# Patient Record
Sex: Male | Born: 1980 | Race: White | Hispanic: No | Marital: Married | Smoking: Former smoker
Health system: Southern US, Community
[De-identification: ages and names within clinical notes are randomized; demographics above are authoritative.]

## PROBLEM LIST (undated history)

## (undated) DIAGNOSIS — J45909 Unspecified asthma, uncomplicated: Secondary | ICD-10-CM

## (undated) DIAGNOSIS — E7800 Pure hypercholesterolemia, unspecified: Secondary | ICD-10-CM

## (undated) DIAGNOSIS — E785 Hyperlipidemia, unspecified: Secondary | ICD-10-CM

## (undated) DIAGNOSIS — R7989 Other specified abnormal findings of blood chemistry: Secondary | ICD-10-CM

## (undated) DIAGNOSIS — R51 Headache: Secondary | ICD-10-CM

## (undated) DIAGNOSIS — R519 Headache, unspecified: Secondary | ICD-10-CM

## (undated) DIAGNOSIS — I319 Disease of pericardium, unspecified: Secondary | ICD-10-CM

## (undated) HISTORY — PX: VASECTOMY: SHX75

## (undated) HISTORY — PX: TONSILLECTOMY: SUR1361

## (undated) HISTORY — DX: Other specified abnormal findings of blood chemistry: R79.89

## (undated) HISTORY — DX: Headache, unspecified: R51.9

## (undated) HISTORY — DX: Disease of pericardium, unspecified: I31.9

## (undated) HISTORY — DX: Pure hypercholesterolemia, unspecified: E78.00

## (undated) HISTORY — PX: RIB FRACTURE SURGERY: SHX2358

## (undated) HISTORY — DX: Unspecified asthma, uncomplicated: J45.909

## (undated) HISTORY — DX: Hyperlipidemia, unspecified: E78.5

## (undated) HISTORY — DX: Headache: R51

---

## 2006-03-31 ENCOUNTER — Emergency Department: Payer: Self-pay | Admitting: Internal Medicine

## 2006-04-01 ENCOUNTER — Emergency Department: Payer: Self-pay | Admitting: Internal Medicine

## 2007-11-06 ENCOUNTER — Ambulatory Visit: Payer: Self-pay | Admitting: General Practice

## 2008-03-12 ENCOUNTER — Ambulatory Visit: Payer: Self-pay | Admitting: General Practice

## 2010-01-10 ENCOUNTER — Emergency Department: Payer: Self-pay | Admitting: Emergency Medicine

## 2010-04-15 IMAGING — CR LEFT THUMB 2+V
1 series · 3 of 3 positions shown · non-contrast
Comparison: none

REASON FOR EXAM: Fall on 11-01-07, left thumb pain
COMMENTS:

PROCEDURE:     DXR - DXR THUMB LEFT HAND (1ST DIGIT)  - November 06, 2007  [DATE]
RESULT:     No fracture, dislocation or other acute bony abnormality is
identified.

[Series 1: view not recorded · 0.17mm/px · 3 of 3 slices shown]
[im 1/3]
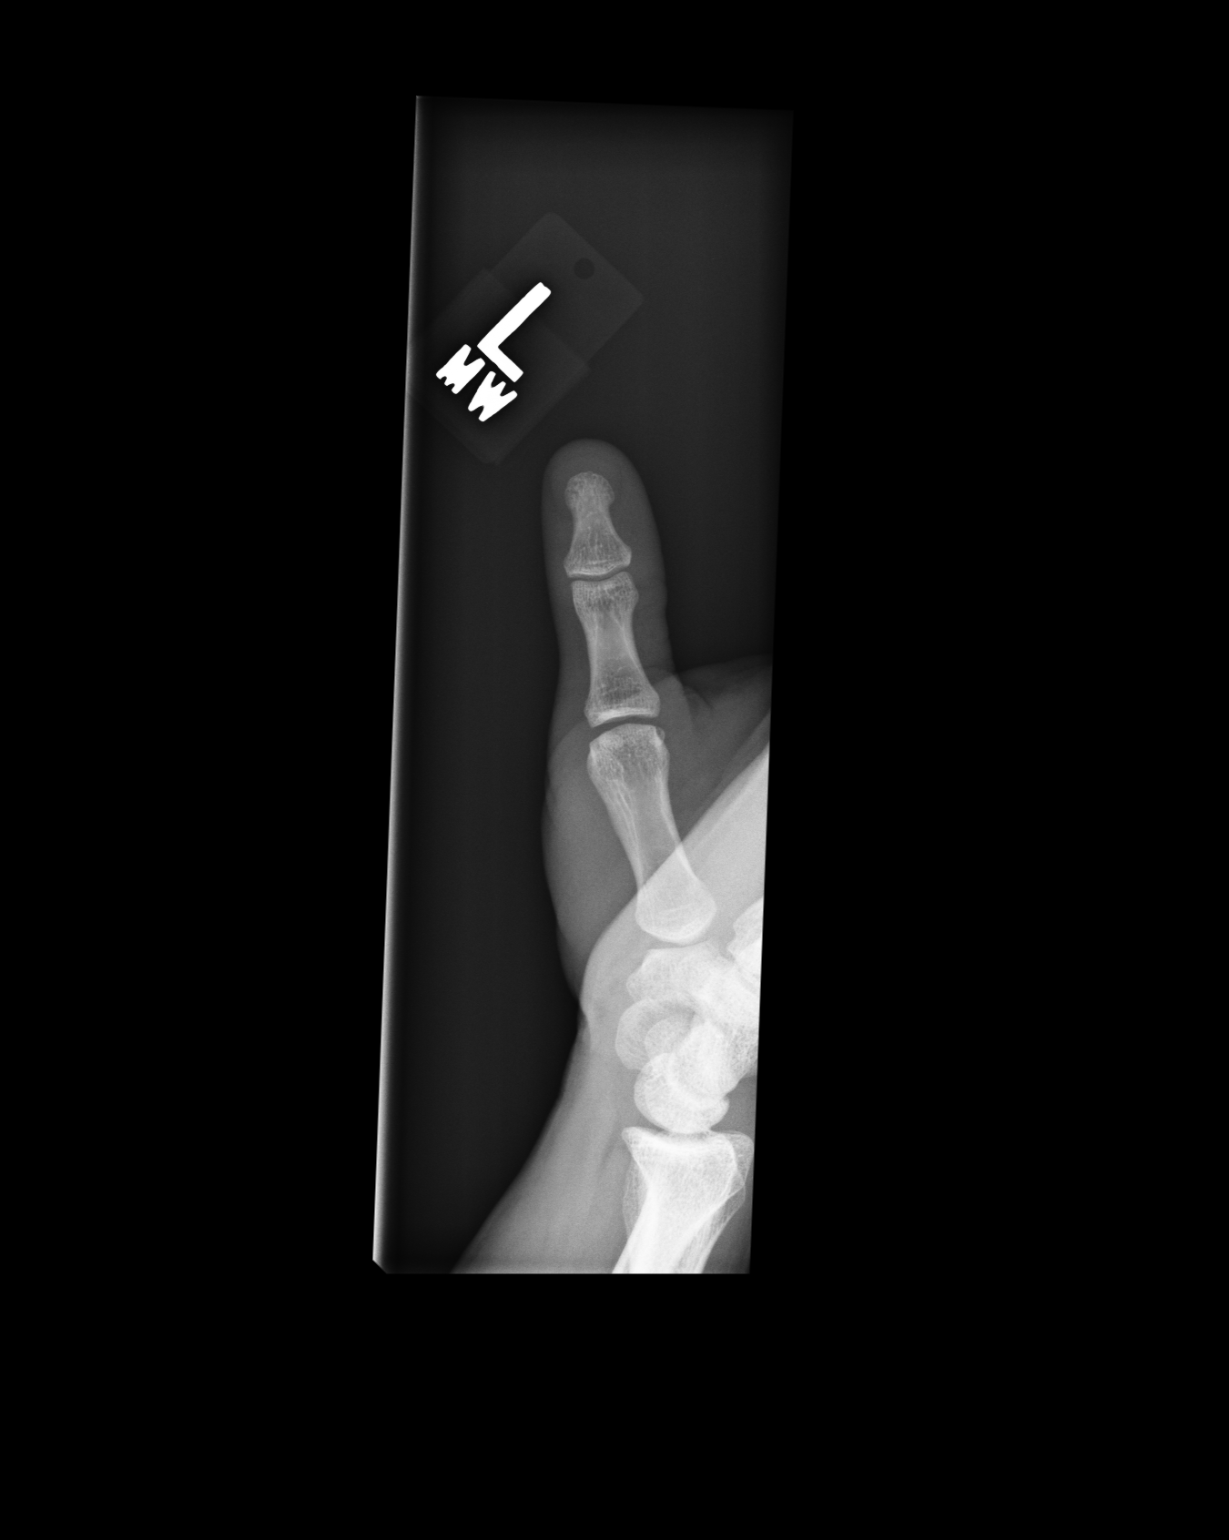
[im 2/3]
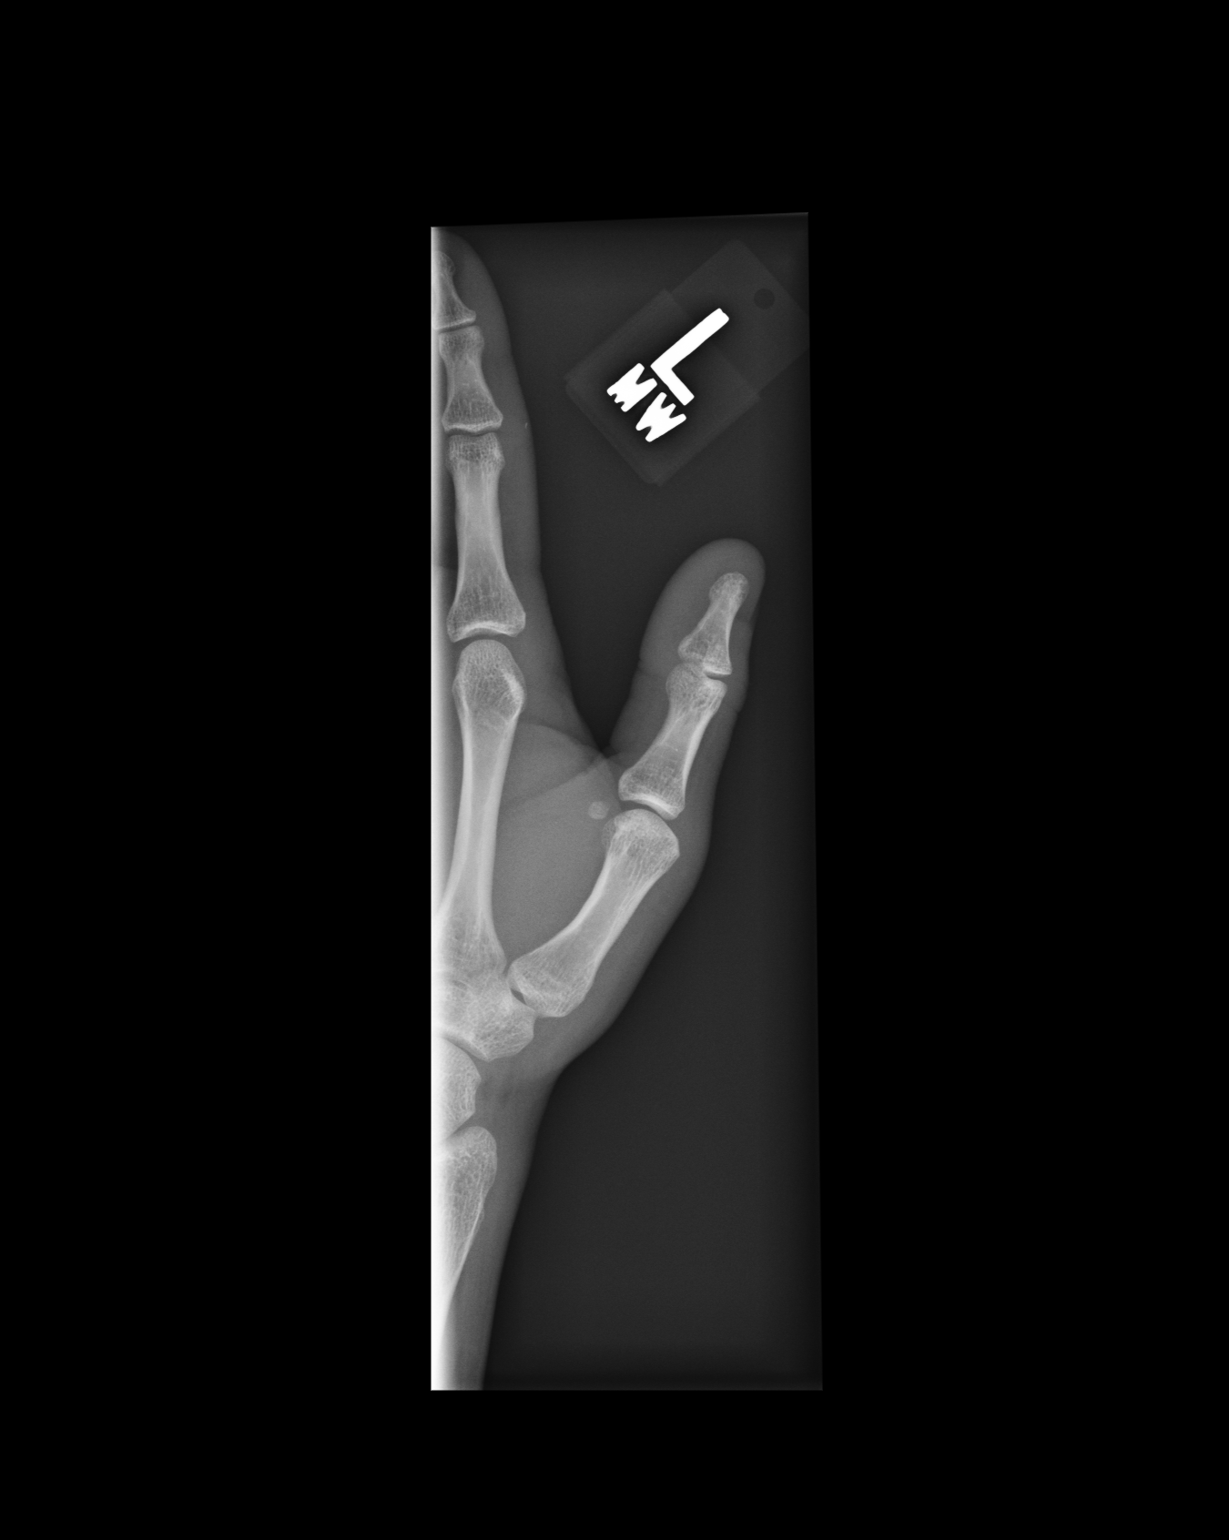
[im 3/3]
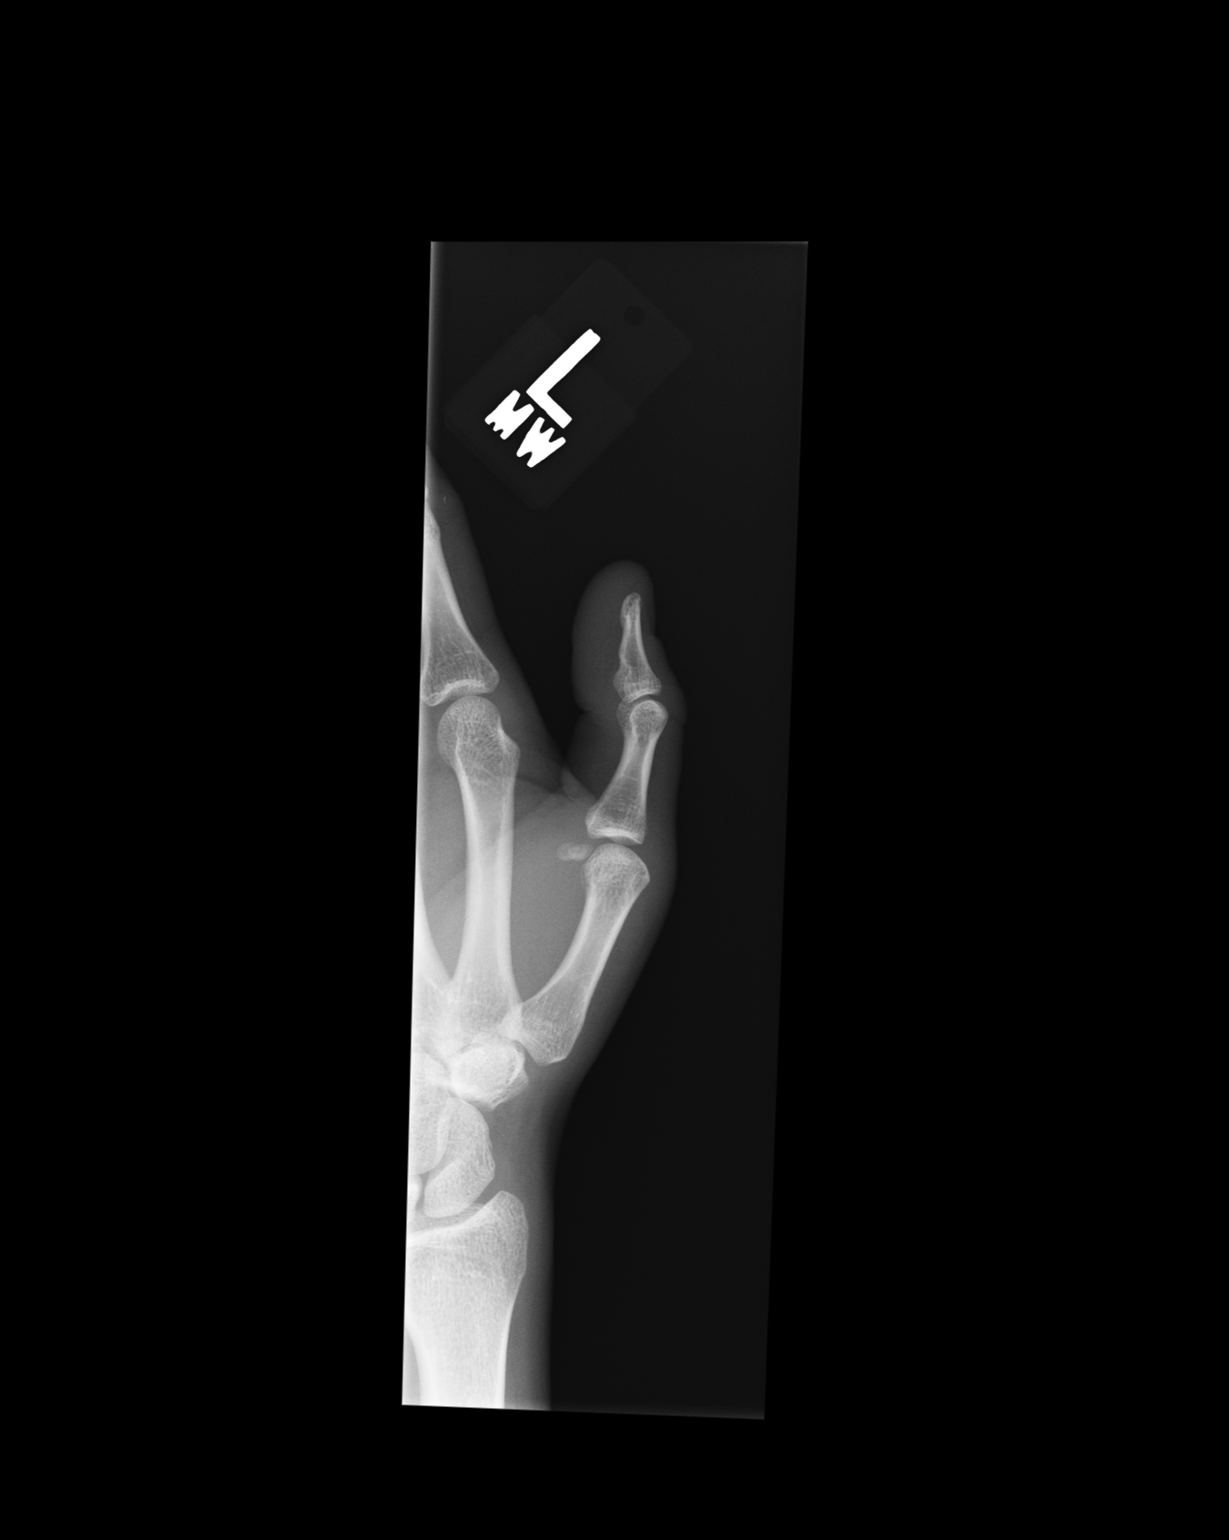

[3 of 3 positions shown; findings below may reference images not displayed]

IMPRESSION: No significant abnormalities are noted.

## 2011-02-07 ENCOUNTER — Emergency Department: Payer: Self-pay | Admitting: *Deleted

## 2012-10-27 DIAGNOSIS — M2392 Unspecified internal derangement of left knee: Secondary | ICD-10-CM | POA: Insufficient documentation

## 2013-07-17 IMAGING — CR RIGHT HAND - COMPLETE 3+ VIEW
1 series · 3 of 3 positions shown · non-contrast
Comparison: none

REASON FOR EXAM: puncture wound with bladed tool., painful and swollen
COMMENTS:

PROCEDURE:     DXR - DXR HAND RT COMPLETE W/OBLIQUES  - February 07, 2011  [DATE]
RESULT:     Comparison: None.

[Series 1: view not recorded · 0.17mm/px · 3 of 3 slices shown]
[im 1/3]
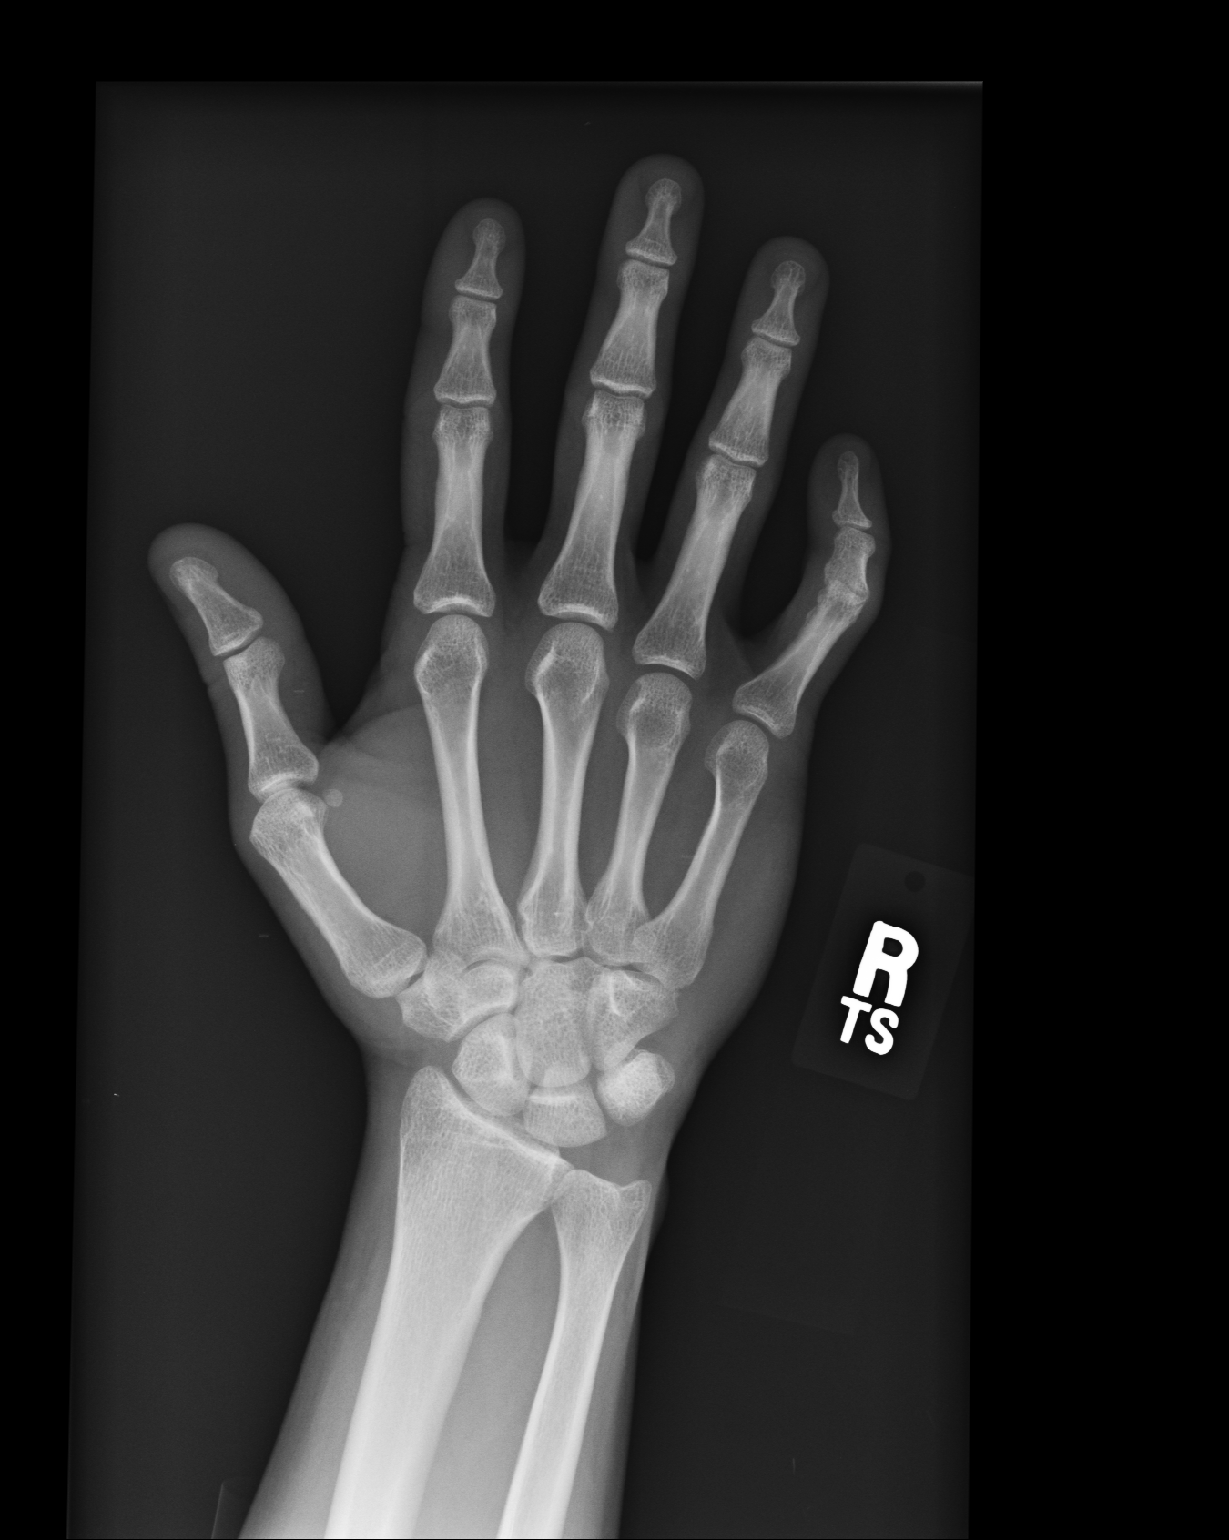
[im 2/3]
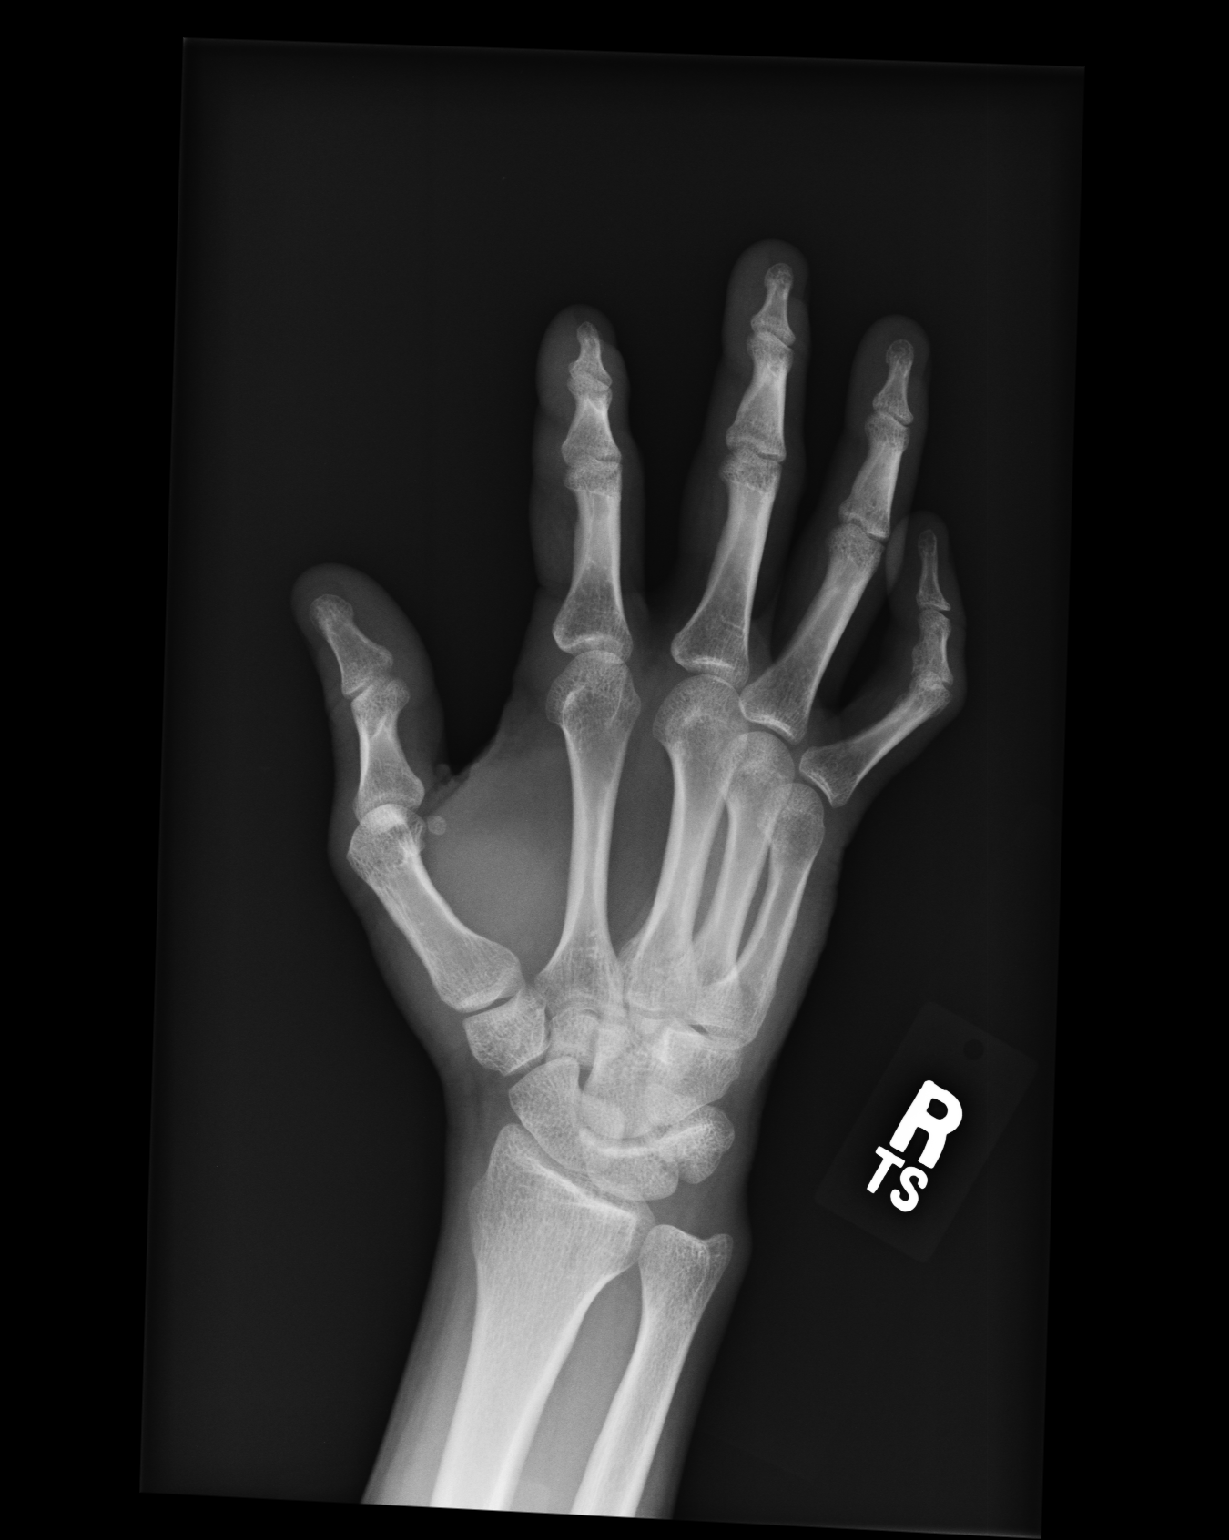
[im 3/3]
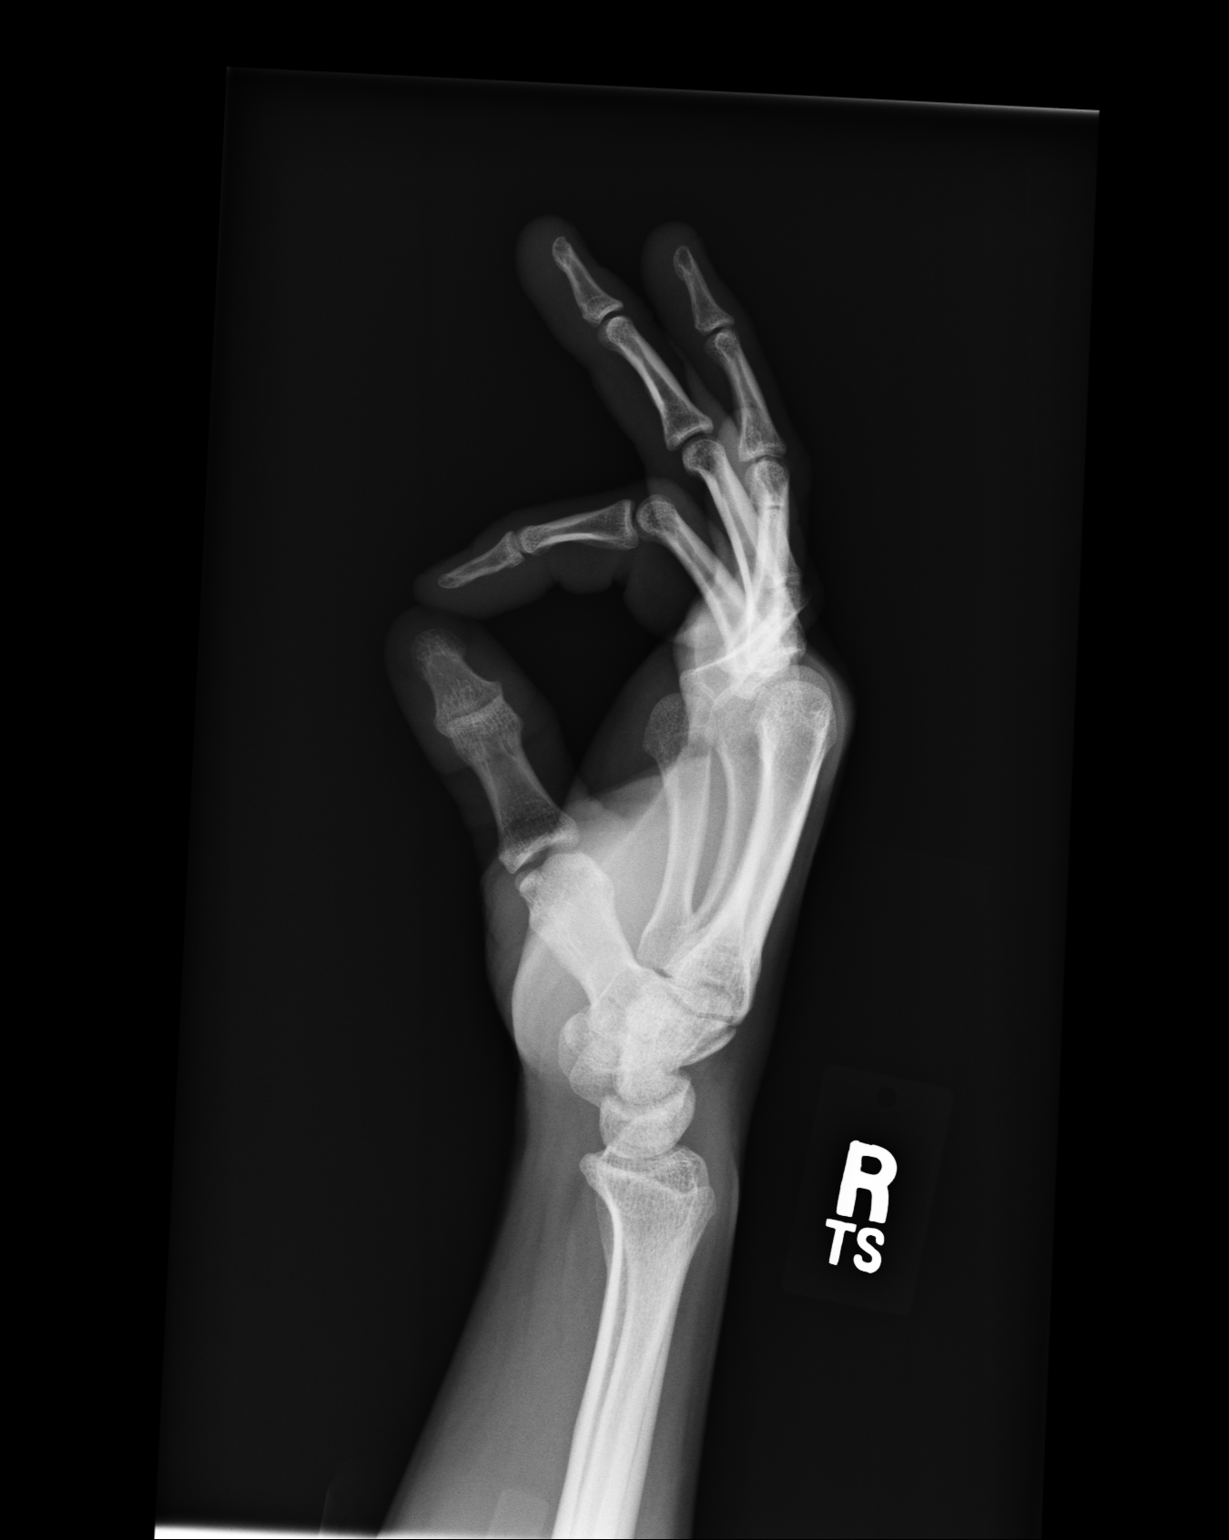

[3 of 3 positions shown; findings below may reference images not displayed]

FINDINGS: No acute fracture. Normal alignment. No radiopaque foreign body.
IMPRESSION: No radiopaque foreign body.

## 2015-09-21 ENCOUNTER — Ambulatory Visit
Admission: RE | Admit: 2015-09-21 | Discharge: 2015-09-21 | Disposition: A | Discharge status: Home / Self Care | Payer: BLUE CROSS/BLUE SHIELD | Source: Ambulatory Visit | Attending: Family Medicine | Admitting: Family Medicine

## 2015-09-21 ENCOUNTER — Other Ambulatory Visit: Payer: Self-pay | Admitting: Family Medicine

## 2015-09-21 DIAGNOSIS — R0600 Dyspnea, unspecified: Secondary | ICD-10-CM

## 2015-10-11 ENCOUNTER — Telehealth: Payer: Self-pay | Admitting: Internal Medicine

## 2015-10-11 ENCOUNTER — Ambulatory Visit (INDEPENDENT_AMBULATORY_CARE_PROVIDER_SITE_OTHER): Payer: BLUE CROSS/BLUE SHIELD | Admitting: Internal Medicine

## 2015-10-11 ENCOUNTER — Encounter: Payer: Self-pay | Admitting: Internal Medicine

## 2015-10-11 DIAGNOSIS — R0600 Dyspnea, unspecified: Secondary | ICD-10-CM

## 2015-10-11 MED ORDER — CETIRIZINE HCL 10 MG PO TABS: 10.0000 mg | ORAL_TABLET | Freq: Every day | ORAL | 3 refills | Status: DC

## 2015-10-11 MED ORDER — ALBUTEROL SULFATE HFA 108 (90 BASE) MCG/ACT IN AERS: 2.0000 | INHALATION_SPRAY | Freq: Four times a day (QID) | RESPIRATORY_TRACT | 2 refills | Status: DC | PRN

## 2015-10-11 MED ORDER — BUDESONIDE-FORMOTEROL FUMARATE 160-4.5 MCG/ACT IN AERO: 2.0000 | INHALATION_SPRAY | Freq: Two times a day (BID) | RESPIRATORY_TRACT | 0 refills | Status: DC

## 2015-10-11 NOTE — Progress Notes (Signed)
[name]      Date: 10/11/2015,   MRN# [id number] PRESTEN BEH July 07, 1980 Code Status:  Code Status History    This patient does not have a recorded code status. Please follow your organizational policy for patients in this situation.     Hosp day:@LENGTHOFSTAYDAYS @ Referring MD: @ATDPROV @     PCP:      AdmissionWeight: 164 lb (74.4 kg)                 CurrentWeight: 164 lb (74.4 kg) Alec Gonzales is a 35 y.o. old male seen in consultation for SOB at the request of Dr. Ellin Goodie.     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, works as IT sales professional, presents to office to day with worsening SOB and DOE for the past 6 weeks He has associated cough and intermittent wheezing with exertion, patient states that his exercise tolerance has declines Going up flight of stair is difficult now and takes long time to catch his breath after exertion Feels tired and fatigued much easier than before No signs of infection at this time Patient states that laughing may cause cough at times, has symptoms of runny nose, watery eyes at times Hot weather worsens his breathing  Patient obtained PFT and CXR which seem to be WNL at this time    PAST MEDICAL HISTORY   Past Medical History:  Diagnosis Date  . Headache   . Hyperlipidemia      SURGICAL HISTORY   Past Surgical History:  Procedure Laterality Date  . RIB FRACTURE SURGERY    . TONSILLECTOMY       FAMILY HISTORY   Family History  Problem Relation Age of Onset  . Hypertension Mother   . Hyperlipidemia Mother   . Hypertension Father   . Hyperlipidemia Father   . Stroke Maternal Grandfather   . Heart disease Paternal Grandfather      SOCIAL HISTORY   Social History  Substance Use Topics  . Smoking status: Current Some Day Smoker    Types: Cigarettes  . Smokeless tobacco: Former Neurosurgeon    Types: Snuff     Comment: occ  . Alcohol use  Yes     Comment: occ     MEDICATIONS    Home Medication:  Current Outpatient Rx  . Order #: [id number] Class: Normal  . Order #: [id number] Class: Sample  . Order #: [id number] Class: Normal    Current Medication:  Current Outpatient Prescriptions:  .  albuterol (PROVENTIL HFA;VENTOLIN HFA) 108 (90 Base) MCG/ACT inhaler, Inhale 2 puffs into the lungs every 6 (six) hours as needed for wheezing or shortness of breath., Disp: 1 Inhaler, Rfl: 2 .  budesonide-formoterol (SYMBICORT) 160-4.5 MCG/ACT inhaler, Inhale 2 puffs into the lungs 2 (two) times daily., Disp: 1 Inhaler, Rfl: 0 .  cetirizine (ZYRTEC) 10 MG tablet, Take 1 tablet (10 mg total) by mouth daily., Disp: 30 tablet, Rfl: 3    ALLERGIES   Prednisone     REVIEW OF SYSTEMS   Review of Systems  Constitutional: Negative for chills, diaphoresis, fever, malaise/fatigue and weight loss.  HENT: Positive for congestion. Negative for hearing loss.   Eyes: Negative for blurred vision and double vision.  Respiratory: Positive for cough, shortness of breath and wheezing. Negative for hemoptysis and sputum production.   Cardiovascular: Negative for chest pain, palpitations, orthopnea and leg swelling.  Gastrointestinal: Negative for abdominal pain, blood in  stool, constipation, diarrhea, heartburn, nausea and vomiting.  Genitourinary: Negative for dysuria and urgency.  Musculoskeletal: Negative for back pain, myalgias and neck pain.  Skin: Negative for rash.  Neurological: Positive for dizziness. Negative for tingling, tremors, weakness and headaches.  Endo/Heme/Allergies: Does not bruise/bleed easily.  Psychiatric/Behavioral: Negative for depression, substance abuse and suicidal ideas.  All other systems reviewed and are negative.    VS: BP 140/90 (BP Location: Left Arm, Cuff Size: Normal)   Pulse 64   Ht  (1.753 m)   Wt 164 lb (74.4 kg)   SpO2 98%   BMI 24.22 kg/m      PHYSICAL EXAM  Physical Exam    Constitutional: He is oriented to person, place, and time. He appears well-developed and well-nourished. No distress.  HENT:  Head: Normocephalic and atraumatic.  Mouth/Throat: No oropharyngeal exudate.  Eyes: EOM are normal. Pupils are equal, round, and reactive to light. No scleral icterus.  Neck: Normal range of motion. Neck supple.  Cardiovascular: Normal rate, regular rhythm and normal heart sounds.   No murmur heard. Pulmonary/Chest: No stridor. He has no wheezes.  Abdominal: Soft. Bowel sounds are normal.  Musculoskeletal: Normal range of motion. He exhibits no edema.  Neurological: He is alert and oriented to person, place, and time. No cranial nerve deficit.  Skin: Skin is warm. He is not diaphoretic.  Psychiatric: He has a normal mood and affect.           IMAGING    Dg Chest 2 View  Result Date: 09/22/2015 CLINICAL DATA:  Shortness of breath for 1 month. EXAM: CHEST  2 VIEW COMPARISON:  None. FINDINGS: Lungs are clear. The heart size and pulmonary vascularity are normal. No adenopathy. No bone lesions. IMPRESSION: No edema or consolidation. Electronically Signed   By: Bretta Bang III M.D.   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  The Patient requires high complexity decision making for assessment and support, frequent evaluation and titration of therapies, application of advanced monitoring technologies and extensive interpretation of multiple databases.    Patient satisfied with Plan of action and management. All questions answered  Lucie Leather, M.D.  [facility]  Medical Director [facility] Medical Director [facility] Cardio-Pulmonary Department

## 2015-10-11 NOTE — Telephone Encounter (Signed)
Pt called stating he didn't want you to send any notes to his PCP until we have done his PFT. He does work as a IT sales professional and states that if he has reactive airway disease he can cont to work but if it is asthma he would not be able to work. Informed pt we haven't sent anything to his PCP at this time. Informed that once he had the PFT done then we would go from there at his follow up. Pt is ok with this. States he wants to discuss this before you put any notes and send them.

## 2015-10-11 NOTE — Progress Notes (Signed)
Patient ID: Alec Gonzales, male   DOB: [birth year], 35 y.o.   MRN: [id number] Patient seen in the office today and instructed on use of SYMBICORT HFA.  Patient expressed understanding and demonstrated technique.

## 2015-10-11 NOTE — Patient Instructions (Signed)
Start Symbicort use as directed Albuterol as needed Start zyrtec Stop smoking   Asthma Attack Prevention While you may not be able to control the fact that you have asthma, you can take actions to prevent asthma attacks. The best way to prevent asthma attacks is to maintain good control of your asthma. You can achieve this by:  Taking your medicines as directed.  Avoiding things that can irritate your airways or make your asthma symptoms worse (asthma triggers).  Keeping track of how well your asthma is controlled and of any changes in your symptoms.  Responding quickly to worsening asthma symptoms (asthma attack).  Seeking emergency care when it is needed. WHAT ARE SOME WAYS TO PREVENT AN ASTHMA ATTACK? Have a Plan Work with your health care provider to create a written plan for managing and treating your asthma attacks (asthma action plan). This plan includes:  A list of your asthma triggers and how you can avoid them.  Information on when medicines should be taken and when their dosages should be changed.  The use of a device that measures how well your lungs are working (peak flow meter). Monitor Your Asthma Use your peak flow meter and record your results in a journal every day. A drop in your peak flow numbers on one or more days may indicate the start of an asthma attack. This can happen even before you start to feel symptoms. You can prevent an asthma attack from getting worse by following the steps in your asthma action plan. Avoid Asthma Triggers Work with your asthma health care provider to find out what your asthma triggers are. This can be done by:  Allergy testing.  Keeping a journal that notes when asthma attacks occur and the factors that may have contributed to them.  Determining if there are other medical conditions that are making your asthma worse. Once you have determined your asthma triggers, take steps to avoid them. This may include avoiding excessive or  prolonged exposure to:  Dust. Have someone dust and vacuum your home for you once or twice a week. Using a high-efficiency particulate arrestance (HEPA) vacuum is best.  Smoke. This includes campfire smoke, forest fire smoke, and secondhand smoke from tobacco products.  Pet dander. Avoid contact with animals that you know you are allergic to.  Allergens from trees, grasses or pollens. Avoid spending a lot of time outdoors when pollen counts are high, and on very windy days.  Very cold, dry, or humid air.  Mold.  Foods that contain high amounts of sulfites.  Strong odors.  Outdoor air pollutants, such as Museum/gallery exhibitions officer.  Indoor air pollutants, such as aerosol sprays and fumes from household cleaners.  Household pests, including dust mites and cockroaches, and pest droppings.  Certain medicines, including NSAIDs. Always talk to your health care provider before stopping or starting any new medicines. Medicines Take over-the-counter and prescription medicines only as told by your health care provider. Many asthma attacks can be prevented by carefully following your medicine schedule. Taking your medicines correctly is especially important when you cannot avoid certain asthma triggers. Act Quickly If an asthma attack does happen, acting quickly can decrease how severe it is and how long it lasts. Take these steps:   Pay attention to your symptoms. If you are coughing, wheezing, or having difficulty breathing, do not wait to see if your symptoms go away on their own. Follow your asthma action plan.  If you have followed your asthma action plan and your  symptoms are not improving, call your health care provider or seek immediate medical care at the nearest hospital. It is important to note how often you need to use your fast-acting rescue inhaler. If you are using your rescue inhaler more often, it may mean that your asthma is not under control. Adjusting your asthma treatment plan may help  you to prevent future asthma attacks and help you to gain better control of your condition. HOW CAN I PREVENT AN ASTHMA ATTACK WHEN I EXERCISE? Follow advice from your health care provider about whether you should use your fast-acting inhaler before exercising. Many people with asthma experience exercise-induced bronchoconstriction (EIB). This condition often worsens during vigorous exercise in cold, humid, or dry environments. Usually, people with EIB can stay very active by pre-treating with a fast-acting inhaler before exercising.   This information is not intended to replace advice given to you by your health care provider. Make sure you discuss any questions you have with your health care provider.   Document Released: 02/15/2009 Document Revised: 11/18/2014 Document Reviewed: 07/30/2014 Elsevier Interactive Patient Education [employer].

## 2015-10-11 NOTE — Telephone Encounter (Signed)
Pt calling asking if we can call him back regarding the information we will be sending to his PCP  Please do not send anything to them before talking patient  Please call back.

## 2015-11-01 ENCOUNTER — Encounter: Payer: Self-pay | Admitting: Internal Medicine

## 2015-11-01 ENCOUNTER — Encounter: Payer: Self-pay | Admitting: *Deleted

## 2015-11-01 ENCOUNTER — Ambulatory Visit (INDEPENDENT_AMBULATORY_CARE_PROVIDER_SITE_OTHER): Payer: BLUE CROSS/BLUE SHIELD | Admitting: Internal Medicine

## 2015-11-01 VITALS — BP 124/86 | HR 72 | Ht 69.0 in | Wt 167.0 lb

## 2015-11-01 DIAGNOSIS — J45909 Unspecified asthma, uncomplicated: Secondary | ICD-10-CM | POA: Insufficient documentation

## 2015-11-01 MED ORDER — FLUTICASONE FUROATE-VILANTEROL 100-25 MCG/INH IN AEPB: 1.0000 | INHALATION_SPRAY | Freq: Every day | RESPIRATORY_TRACT | 0 refills | Status: AC

## 2015-11-01 NOTE — Addendum Note (Signed)
Addended by: Meyer CoryAHMAD, Devora Tortorella R on: 11/01/2015 10:05 AM   Modules accepted: Orders

## 2015-11-01 NOTE — Progress Notes (Signed)
[facility]      Date: 11/01/2015,   MRN# [id number] Alec MaterSteven A Gonzales 1980/07/14 Code Status:  Code Status History    This patient does not have a recorded code status. Please follow your organizational policy for patients in this situation.     Hosp day:@LENGTHOFSTAYDAYS @ Referring MD: @ATDPROV @     PCP:      AdmissionWeight: 167 lb (75.8 kg)                 CurrentWeight: 167 lb (75.8 kg) Alec MaterSteven A Gonzales is a 35 y.o. old male seen in consultation for SOB at the request of Dr. Ellin Goodieabinowitz.     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  Feels tired and fatigued much easier than before No signs of infection at this time Patient states that laughing may cause cough at times, has symptoms of runny nose, watery eyes at times Hot weather worsens his breathing    Current Medication:  Current Outpatient Prescriptions:  .  albuterol (PROVENTIL HFA;VENTOLIN HFA) 108 (90 Base) MCG/ACT inhaler, Inhale 2 puffs into the lungs every 6 (six) hours as needed for wheezing or shortness of breath., Disp: 1 Inhaler, Rfl: 2 .  cetirizine (ZYRTEC) 10 MG tablet, Take 1 tablet (10 mg total) by mouth daily., Disp: 30 tablet, Rfl: 3 .  budesonide-formoterol (SYMBICORT) 160-4.5 MCG/ACT inhaler, Inhale 2 puffs into the lungs 2 (two) times daily., Disp: 1 Inhaler, Rfl: 0    ALLERGIES   Prednisone     REVIEW OF  SYSTEMS   Review of Systems  Constitutional: Negative for chills, fever, malaise/fatigue and weight loss.  HENT: Negative for congestion and hearing loss.   Eyes: Negative for blurred vision and double vision.  Respiratory: Positive for cough, shortness of breath and wheezing. Negative for hemoptysis and sputum production.   Cardiovascular: Negative for chest pain, palpitations, orthopnea and leg swelling.  Gastrointestinal: Negative for abdominal pain, heartburn, nausea and vomiting.  Skin: Negative for rash.  Neurological: Negative for headaches.  All other systems reviewed and are negative.    VS: BP 124/86 (Cuff Size: Normal)   Pulse 72   Ht 5\' 9"  (1.753 m)   Wt 167 lb (75.8 kg)   SpO2 96%   BMI 24.66 kg/m      PHYSICAL EXAM  Physical Exam  Constitutional: He is oriented to person, place, and time. He appears well-developed and well-nourished. No distress.  HENT:  Mouth/Throat: No oropharyngeal exudate.  Cardiovascular: Normal rate, regular rhythm and normal heart sounds.   No murmur heard. Pulmonary/Chest: No stridor. He has no wheezes.  Musculoskeletal: Normal range of motion. He exhibits no edema.  Neurological: He is alert and oriented to person, place, and time. No cranial nerve deficit.  Skin: Skin is warm. He is not diaphoretic.  Psychiatric: He has a normal mood and affect.      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 Symbicort is not helping will switch to different inhaler    1.stop Symbicort- will start BREO 2.albuterol as needed 3.continue zyrtec 4.smoking cessation advised 5.will write letter to chief regarding moving to another location for work as his symptoms started at end of May when he was transferred to different station -recommend changing locations  Will follow up in 3 months to assess resp status     The  Patient requires high complexity decision making for assessment and support, frequent evaluation and titration of therapies, application of advanced monitoring technologies and extensive interpretation of multiple databases.   Patient satisfied with Plan of action and management. All questions answered  Lucie LeatherKurian David Calin Fantroy, M.D.  [facility]  Medical Director [facility] Medical Director [facility] Card[facility] Department

## 2015-11-01 NOTE — Patient Instructions (Signed)
Letter to STation Chief to change stations Will stop Symbicort and Start BREO ALbuterol as needed

## 2015-11-01 NOTE — Progress Notes (Signed)
Patient ID: Alec MaterSteven A Gonzales, male   DOB: [birth year], 35 y.o.   MRN: [id number]   Patient seen in the office today and instructed on use of Breo 100.  Patient expressed understanding and demonstrated technique.

## 2015-11-11 ENCOUNTER — Telehealth: Payer: Self-pay | Admitting: Internal Medicine

## 2015-11-11 MED ORDER — FLUTICASONE FUROATE-VILANTEROL 100-25 MCG/INH IN AEPB: 1.0000 | INHALATION_SPRAY | Freq: Every day | RESPIRATORY_TRACT | 5 refills | Status: DC

## 2015-11-11 NOTE — Telephone Encounter (Signed)
°*  STAT* If patient is at the pharmacy, call can be transferred to refill team.   1. Which medications need to be refilled? (please list name of each medication and dose if known)  Brio   2. Which pharmacy/location (including street and city if local pharmacy) is medication to be sent to? [facility] [place]  3. Do they need a 30 day or 90 day supply? 90 day   Pt states he was going to get a call before we sent it in to give him instructions on how to use the coupon

## 2015-11-11 NOTE — Telephone Encounter (Signed)
Spoke with pt and informed him RX is being sent with refills. Discussed coupon. Nothing further needed.

## 2018-12-06 ENCOUNTER — Other Ambulatory Visit: Payer: Self-pay | Admitting: Internal Medicine

## 2018-12-06 ENCOUNTER — Telehealth: Payer: Self-pay

## 2018-12-06 NOTE — Telephone Encounter (Signed)
Patient called and requested refill of Breo.  Last refill was 07/17/2018 to [facility] [place].  Patient asked for name of Pulmonologist he saw in 2017 -   Kasa Kurian, MD.

## 2018-12-06 NOTE — Telephone Encounter (Signed)
Message reviewed. Tried to refill the [facility] for one month with no refills  Agree with him contacting pulmonary who prescribed this medicine for him. His pharmacy was not in [place]. Can call a one month supply into his preferred pharmacy when find this out. Thanks,

## 2018-12-11 ENCOUNTER — Ambulatory Visit: Payer: Self-pay

## 2018-12-11 DIAGNOSIS — Z23 Encounter for immunization: Secondary | ICD-10-CM

## 2018-12-13 ENCOUNTER — Telehealth: Payer: Self-pay

## 2018-12-13 NOTE — Telephone Encounter (Signed)
Alec Gonzales called stating [facility]) states they haven't received an order from Dr. Roxan Hockey to renew [facility] inhaler.  Found the notes in Epic where Dr. Roxan Hockey agreed to renew Alec Gonzales for 30 days until he sees his pulmonologist (12/27/2018).  Breo Ellipta 100/25 mcg 1 puff qd Qty. 1  Refills:  0 Prescriber:  Shiela Mayer, MD NPI # [id number] Rx called into [facility]) [phone]  AMD

## 2018-12-26 ENCOUNTER — Ambulatory Visit: Payer: Self-pay

## 2018-12-26 ENCOUNTER — Other Ambulatory Visit: Payer: Self-pay

## 2018-12-26 DIAGNOSIS — Z0000 Encounter for general adult medical examination without abnormal findings: Secondary | ICD-10-CM

## 2018-12-27 ENCOUNTER — Ambulatory Visit
Admission: RE | Admit: 2018-12-27 | Discharge: 2018-12-27 | Disposition: A | Discharge status: Home / Self Care | Payer: 59 | Attending: Internal Medicine | Admitting: Internal Medicine

## 2018-12-27 ENCOUNTER — Other Ambulatory Visit
Admission: RE | Admit: 2018-12-27 | Discharge: 2018-12-27 | Disposition: A | Discharge status: Home / Self Care | Payer: 59 | Source: Home / Self Care | Attending: Internal Medicine | Admitting: Internal Medicine

## 2018-12-27 ENCOUNTER — Ambulatory Visit
Admission: RE | Admit: 2018-12-27 | Discharge: 2018-12-27 | Disposition: A | Discharge status: Home / Self Care | Payer: 59 | Source: Ambulatory Visit | Attending: Internal Medicine | Admitting: Internal Medicine

## 2018-12-27 ENCOUNTER — Ambulatory Visit (INDEPENDENT_AMBULATORY_CARE_PROVIDER_SITE_OTHER): Payer: 59 | Admitting: Internal Medicine

## 2018-12-27 ENCOUNTER — Encounter: Payer: Self-pay | Admitting: Internal Medicine

## 2018-12-27 VITALS — BP 120/84 | HR 97 | Temp 98.6°F | Ht 68.0 in | Wt 171.4 lb

## 2018-12-27 DIAGNOSIS — R0602 Shortness of breath: Secondary | ICD-10-CM

## 2018-12-27 DIAGNOSIS — J4520 Mild intermittent asthma, uncomplicated: Secondary | ICD-10-CM

## 2018-12-27 LAB — CMP12+LP+TP+TSH+6AC+PSA+CBC…
ALT: 20 IU/L (ref 0–44)
AST: 22 IU/L (ref 0–40)
Albumin/Globulin Ratio: 2.1 (ref 1.2–2.2)
Albumin: 4.4 g/dL (ref 4.0–5.0)
Alkaline Phosphatase: 56 IU/L (ref 39–117)
BUN/Creatinine Ratio: 21 — ABNORMAL HIGH (ref 9–20)
BUN: 21 mg/dL — ABNORMAL HIGH (ref 6–20)
Basophils Absolute: 0.1 10*3/uL (ref 0.0–0.2)
Basos: 1 %
Bilirubin Total: 1.4 mg/dL — ABNORMAL HIGH (ref 0.0–1.2)
Calcium: 9.2 mg/dL (ref 8.7–10.2)
Chloride: 102 mmol/L (ref 96–106)
Chol/HDL Ratio: 6.9 ratio — ABNORMAL HIGH (ref 0.0–5.0)
Cholesterol, Total: 255 mg/dL — ABNORMAL HIGH (ref 100–199)
Creatinine, Ser: 1.01 mg/dL (ref 0.76–1.27)
EOS (ABSOLUTE): 0.2 10*3/uL (ref 0.0–0.4)
Eos: 2 %
Estimated CHD Risk: 1.5 times avg. — ABNORMAL HIGH (ref 0.0–1.0)
Free Thyroxine Index: 1.7 (ref 1.2–4.9)
GFR calc Af Amer: 110 mL/min/{1.73_m2} (ref >59)
GFR calc non Af Amer: 95 mL/min/{1.73_m2} (ref >59)
GGT: 13 IU/L (ref 0–65)
Globulin, Total: 2.1 g/dL (ref 1.5–4.5)
Glucose: 85 mg/dL (ref 65–99)
HDL: 37 mg/dL — ABNORMAL LOW (ref >39)
Hematocrit: 47.7 % (ref 37.5–51.0)
Hemoglobin: 15.7 g/dL (ref 13.0–17.7)
Immature Grans (Abs): 0 10*3/uL (ref 0.0–0.1)
Immature Granulocytes: 0 %
Iron: 162 ug/dL (ref 38–169)
LDH: 187 IU/L (ref 121–224)
LDL Chol Calc (NIH): 188 mg/dL — ABNORMAL HIGH (ref 0–99)
Lymphocytes Absolute: 2.3 10*3/uL (ref 0.7–3.1)
Lymphs: 24 %
MCH: 30.7 pg (ref 26.6–33.0)
MCHC: 32.9 g/dL (ref 31.5–35.7)
MCV: 93 fL (ref 79–97)
Monocytes Absolute: 0.7 10*3/uL (ref 0.1–0.9)
Monocytes: 7 %
Neutrophils Absolute: 6.4 10*3/uL (ref 1.4–7.0)
Neutrophils: 66 %
Phosphorus: 3.8 mg/dL (ref 2.8–4.1)
Platelets: 209 10*3/uL (ref 150–450)
Potassium: 4.4 mmol/L (ref 3.5–5.2)
Prostate Specific Ag, Serum: 1.3 ng/mL (ref 0.0–4.0)
RBC: 5.11 x10E6/uL (ref 4.14–5.80)
RDW: 12 % (ref 11.6–15.4)
Sodium: 138 mmol/L (ref 134–144)
T3 Uptake Ratio: 27 % (ref 24–39)
T4, Total: 6.4 ug/dL (ref 4.5–12.0)
TSH: 3.95 u[IU]/mL (ref 0.450–4.500)
Total Protein: 6.5 g/dL (ref 6.0–8.5)
Triglycerides: 162 mg/dL — ABNORMAL HIGH (ref 0–149)
Uric Acid: 5.9 mg/dL (ref 3.7–8.6)
VLDL Cholesterol Cal: 30 mg/dL (ref 5–40)
WBC: 9.6 10*3/uL (ref 3.4–10.8)

## 2018-12-27 LAB — POCT URINALYSIS DIPSTICK
Bilirubin, UA: NEGATIVE
Blood, UA: NEGATIVE
Glucose, UA: NEGATIVE
Ketones, UA: NEGATIVE
Leukocytes, UA: NEGATIVE
Nitrite, UA: NEGATIVE
Protein, UA: NEGATIVE
Spec Grav, UA: >=1.03 — AB (ref 1.010–1.025)
Urobilinogen, UA: 0.2 E.U./dL
pH, UA: 5.5 (ref 5.0–8.0)

## 2018-12-27 LAB — CBC WITH DIFFERENTIAL/PLATELET
Abs Immature Granulocytes: 0.02 10*3/uL (ref 0.00–0.07)
Basophils Absolute: 0.1 10*3/uL (ref 0.0–0.1)
Basophils Relative: 1 %
Eosinophils Absolute: 0.3 10*3/uL (ref 0.0–0.5)
Eosinophils Relative: 3 %
HCT: 44.2 % (ref 39.0–52.0)
Hemoglobin: 15.4 g/dL (ref 13.0–17.0)
Immature Granulocytes: 0 %
Lymphocytes Relative: 25 %
Lymphs Abs: 2.4 10*3/uL (ref 0.7–4.0)
MCH: 31.6 pg (ref 26.0–34.0)
MCHC: 34.8 g/dL (ref 30.0–36.0)
MCV: 90.8 fL (ref 80.0–100.0)
Monocytes Absolute: 0.7 10*3/uL (ref 0.1–1.0)
Monocytes Relative: 7 %
Neutro Abs: 6.2 10*3/uL (ref 1.7–7.7)
Neutrophils Relative %: 64 %
Platelets: 206 10*3/uL (ref 150–400)
RBC: 4.87 MIL/uL (ref 4.22–5.81)
RDW: 11.4 % — ABNORMAL LOW (ref 11.5–15.5)
WBC: 9.7 10*3/uL (ref 4.0–10.5)
nRBC: 0 % (ref 0.0–0.2)

## 2018-12-27 MED ORDER — ALBUTEROL SULFATE HFA 108 (90 BASE) MCG/ACT IN AERS: 2.0000 | INHALATION_SPRAY | Freq: Four times a day (QID) | RESPIRATORY_TRACT | 6 refills | Status: DC | PRN

## 2018-12-27 MED ORDER — FLUTICASONE FUROATE-VILANTEROL 100-25 MCG/INH IN AEPB: 1.0000 | INHALATION_SPRAY | Freq: Every day | RESPIRATORY_TRACT | 5 refills | Status: DC

## 2018-12-27 MED ORDER — SPIRIVA RESPIMAT 1.25 MCG/ACT IN AERS: 2.0000 | INHALATION_SPRAY | Freq: Two times a day (BID) | RESPIRATORY_TRACT | 6 refills | Status: DC

## 2018-12-27 NOTE — Patient Instructions (Signed)
OBTAIN PFT's  Check CBC with DIFF  START SPIRIVA RESPIMAT  CONTINUE BREO  ALBUTEROL AS NEEDED  GET CXR

## 2018-12-27 NOTE — Progress Notes (Signed)
[facility] Pulmonary Medicine Consultation        AdmissionWeight: 171 lb 6.4 oz (77.7 kg)                 CurrentWeight: 171 lb 6.4 oz (77.7 kg) Alec Gonzales is a 38 y.o. old male seen in consultation for SOB at the request of Dr. Ellin Goodie.     CHIEF COMPLAINT:  ASTHMA    HISTORY OF PRESENT ILLNESS    Patient was previously seen 3 years ago for worsening shortness of breath and a diagnosis of asthma There is a possibility of COPD overlap due to the fact that he is a former smoker and continues to smoke  Patient has been on Breo for the last several years and it has helped However in the past several months his shortness of breath is progressively getting worse He also has been having persistent wheezing more so over the last couple months Albuterol use is infrequent but has been using more in the last couple of months  Patient was transferred to a different fire station which he thinks was causing most of the 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 over the last 6 months Going up a flight of his stairs is difficult at times He feels tired and fatigued with chest tightness  No  exacerbation at this time No evidence of heart failure at this time No evidence or signs of infection at this time No respiratory distress No fevers, chills, nausea, vomiting, diarrhea No evidence of lower extremity edema No evidence hemoptysis   Smoking Assessment and Cessation Counseling   Upon further questioning, Patient smokes 1/4 PPD  I have advised patient to quit/stop smoking as soon as possible due to high risk for multiple medical problems  Patient  is NOT willing to quit smoking  I have advised patient that we can assist and have options of Nicotine replacement therapy. I also advised patient on behavioral therapy and can provide oral medication therapy in conjunction with the other therapies  Follow up next Office visit  for assessment of  smoking cessation  Smoking cessation counseling advised for 4 minutes    Current Medication:  Current Outpatient Medications:  .  albuterol (PROVENTIL HFA;VENTOLIN HFA) 108 (90 Base) MCG/ACT inhaler, Inhale 2 puffs into the lungs every 6 (six) hours as needed for wheezing or shortness of breath., Disp: 1 Inhaler, Rfl: 2 .  cetirizine (ZYRTEC) 10 MG tablet, Take 1 tablet (10 mg total) by mouth daily., Disp: 30 tablet, Rfl: 3 .  fluticasone furoate-vilanterol (BREO ELLIPTA) 100-25 MCG/INH AEPB, Inhale 1 puff into the lungs daily., Disp: 60 each, Rfl: 5 .  Multiple Vitamin (MULTIVITAMIN) tablet, Take 1 tablet by mouth daily., Disp: , Rfl:  .  omega-3 acid ethyl esters (LOVAZA) 1 g capsule, Take 1 g by mouth daily., Disp: , Rfl:  .  vitamin C (ASCORBIC ACID) 500 MG tablet, Take 500 mg by mouth daily., Disp: , Rfl:     ALLERGIES   Prednisone     REVIEW OF SYSTEMS   Review of Systems  Constitutional: Negative for chills, fever, malaise/fatigue and weight loss.  HENT: Negative for congestion and hearing loss.   Eyes: Negative for blurred vision and double vision.  Respiratory: Positive for cough, shortness of breath and wheezing. Negative for hemoptysis and sputum production.   Cardiovascular: Negative for chest pain, palpitations, orthopnea and leg swelling.  Gastrointestinal: Negative for abdominal pain, heartburn, nausea and vomiting.  Skin: Negative  for rash.  Neurological: Negative for headaches.  All other systems reviewed and are negative.    VS: BP 120/84   Pulse 97   Temp 98.6 F (37 C) (Temporal)   Ht 5\' 8"  (1.727 m)   Wt 171 lb 6.4 oz (77.7 kg)   SpO2 98%   BMI 26.06 kg/m      PHYSICAL EXAM  Physical Exam  Constitutional: He is oriented to person, place, and time. He appears well-developed and well-nourished. No distress.  HENT:  Mouth/Throat: No oropharyngeal exudate.  Cardiovascular: Normal rate, regular rhythm and normal heart sounds.  No murmur heard.  Pulmonary/Chest: No stridor. He has no wheezes.  Musculoskeletal: Normal range of motion.        General: No edema.  Neurological: He is alert and oriented to person, place, and time. No cranial nerve deficit.  Skin: Skin is warm. He is not diaphoretic.  Psychiatric: He has a normal mood and affect.      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  The importance of social distancing was discussed today. Hand Washing Techniques and avoid touching face was advised.     MEDICATION ADJUSTMENTS/LABS AND TESTS ORDERED: OBTAIN PFT's  Check CBC with DIFF  START SPIRIVA RESPIMAT  CONTINUE BREO  ALBUTEROL AS NEEDED  GET CXR   CURRENT MEDICATIONS REVIEWED AT LENGTH WITH PATIENT TODAY   Patient satisfied with Plan of action and management. All questions answered  Follow up in 3-6 months   Alec Gonzales, M.D.  [facility]  Medical Director [place] Director [facility] Cardio-Pulmonary Department

## 2018-12-30 ENCOUNTER — Telehealth: Payer: Self-pay

## 2018-12-30 NOTE — Telephone Encounter (Signed)
Received notes from 12/27/2018 visit with  Flora Lipps, MD (Pulmonologist) & they were reviewed by Randel Pigg, PA-C (Interim Provider on 12/30/2018).  AMD

## 2019-02-17 ENCOUNTER — Telehealth: Payer: Self-pay | Admitting: Internal Medicine

## 2019-02-17 NOTE — Telephone Encounter (Signed)
Called and spoke with Pamala Hurry and canceled patient's PFT for 02/20/2019. Advised Cardiopulmonary that per pt that he would contact Cardiopulmonary back directly if he decided to R/S per phone message.  Pt was given Cardiopulmonary phone number. Nothing else needed at this time. Rhonda J Cobb

## 2019-02-17 NOTE — Telephone Encounter (Signed)
Spoke to pt, who stated that he wished to cancel PFT, as he will be having one at work.  Pt stated that he will contact pulmonary cardio if he wishes to reschedule. Pt has been provided with number.   Rhonda, please advise.

## 2019-02-18 ENCOUNTER — Encounter: Payer: Self-pay | Admitting: Occupational Medicine

## 2019-02-18 ENCOUNTER — Ambulatory Visit: Payer: 59 | Admitting: Occupational Medicine

## 2019-02-18 ENCOUNTER — Other Ambulatory Visit: Payer: Self-pay

## 2019-02-18 VITALS — BP 124/88 | HR 95 | Temp 98.7°F | Resp 16 | Ht 68.0 in | Wt 172.0 lb

## 2019-02-18 DIAGNOSIS — Z0000 Encounter for general adult medical examination without abnormal findings: Secondary | ICD-10-CM

## 2019-02-19 ENCOUNTER — Other Ambulatory Visit: Payer: 59

## 2019-02-20 ENCOUNTER — Ambulatory Visit: Payer: 59

## 2019-06-11 DIAGNOSIS — H5213 Myopia, bilateral: Secondary | ICD-10-CM | POA: Diagnosis not present

## 2019-06-12 ENCOUNTER — Ambulatory Visit: Payer: 59 | Admitting: Physician Assistant

## 2019-06-12 ENCOUNTER — Other Ambulatory Visit: Payer: Self-pay

## 2019-06-12 ENCOUNTER — Encounter: Payer: Self-pay | Admitting: Physician Assistant

## 2019-06-12 VITALS — BP 131/97 | HR 94 | Temp 98.4°F | Resp 12 | Ht 68.0 in | Wt 173.0 lb

## 2019-06-12 DIAGNOSIS — B356 Tinea cruris: Secondary | ICD-10-CM

## 2019-06-12 MED ORDER — NYSTATIN 100000 UNIT/GM EX CREA: 1.0000 "application " | TOPICAL_CREAM | Freq: Two times a day (BID) | CUTANEOUS | 0 refills | Status: DC

## 2019-06-12 NOTE — Progress Notes (Signed)
Whelps located on hips, upper thighs & buttocks - have been there for about 2 months. Denies pain or itching. Hasn't tried any OTC meds.  States wife is concerned & worried about them.  No changes in personal hygiene products or home cleaning products. 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  AMD

## 2019-06-12 NOTE — Progress Notes (Signed)
   Subjective:Rash    Patient ID: Alec Gonzales, male    DOB: November 06, 1980, 39 y.o.   MRN: [id number]  HPI Patient presents with 2 months of a non-itchy rash to the groin and buttocks area.  Patient states rash started out as a single lesion on the left lateral hip.  Patient state the rash continues to spread bilaterally.  Patient denies new personal or hygiene products.  Except for shaving of the pubic and lower abdominal area no other provocative incident for complaint.   Review of Systems Asthma and allergic rhinitis.    Objective:   Physical Exam No acute distress.  Multiple erythematous macular lesions to the buttocks and lower trunk area.  Definitive scaly borders.  No signs assessment excoriation.       Assessment & Plan: Tinea Cruris  Patient rash is consistent with a fungal infection.  Advised to take cell phone pictures of the lesions.  Patient is allergic to steroids so will be using only nystatin.  Patient will apply medication twice a day.  Advised follow-up in 2 weeks.  If no resolution or worsening complaint will consider dermatology consult.

## 2019-06-19 ENCOUNTER — Other Ambulatory Visit: Payer: Self-pay

## 2019-06-19 ENCOUNTER — Ambulatory Visit: Payer: Self-pay

## 2019-06-19 DIAGNOSIS — Z0000 Encounter for general adult medical examination without abnormal findings: Secondary | ICD-10-CM

## 2019-06-19 LAB — POCT URINALYSIS DIPSTICK
Bilirubin, UA: NEGATIVE
Blood, UA: NEGATIVE
Glucose, UA: NEGATIVE
Ketones, UA: POSITIVE
Leukocytes, UA: NEGATIVE
Nitrite, UA: NEGATIVE
Protein, UA: POSITIVE — AB
Spec Grav, UA: 1.025 (ref 1.010–1.025)
Urobilinogen, UA: 0.2 E.U./dL
pH, UA: 6 (ref 5.0–8.0)

## 2019-06-19 NOTE — Progress Notes (Signed)
Scheduled to complete physical on 07/01/2019 with Bridget Hartshorn, PA-C (Interim Provider).  AMD

## 2019-06-20 LAB — CMP12+LP+TP+TSH+6AC+PSA+CBC…
ALT: 26 IU/L (ref 0–44)
AST: 24 IU/L (ref 0–40)
Albumin/Globulin Ratio: 2 (ref 1.2–2.2)
Albumin: 4.3 g/dL (ref 4.0–5.0)
Alkaline Phosphatase: 51 IU/L (ref 39–117)
BUN/Creatinine Ratio: 18 (ref 9–20)
BUN: 21 mg/dL — ABNORMAL HIGH (ref 6–20)
Basophils Absolute: 0.1 10*3/uL (ref 0.0–0.2)
Basos: 1 %
Bilirubin Total: 1.2 mg/dL (ref 0.0–1.2)
Calcium: 9.5 mg/dL (ref 8.7–10.2)
Chloride: 104 mmol/L (ref 96–106)
Chol/HDL Ratio: 5.9 ratio — ABNORMAL HIGH (ref 0.0–5.0)
Cholesterol, Total: 246 mg/dL — ABNORMAL HIGH (ref 100–199)
Creatinine, Ser: 1.17 mg/dL (ref 0.76–1.27)
EOS (ABSOLUTE): 0.3 10*3/uL (ref 0.0–0.4)
Eos: 4 %
Estimated CHD Risk: 1.2 times avg. — ABNORMAL HIGH (ref 0.0–1.0)
Free Thyroxine Index: 1.4 (ref 1.2–4.9)
GFR calc Af Amer: 92 mL/min/{1.73_m2} (ref >59)
GFR calc non Af Amer: 80 mL/min/{1.73_m2} (ref >59)
GGT: 13 IU/L (ref 0–65)
Globulin, Total: 2.1 g/dL (ref 1.5–4.5)
Glucose: 87 mg/dL (ref 65–99)
HDL: 42 mg/dL (ref >39)
Hematocrit: 46.5 % (ref 37.5–51.0)
Hemoglobin: 16.1 g/dL (ref 13.0–17.7)
Immature Grans (Abs): 0 10*3/uL (ref 0.0–0.1)
Immature Granulocytes: 0 %
Iron: 159 ug/dL (ref 38–169)
LDH: 184 IU/L (ref 121–224)
LDL Chol Calc (NIH): 191 mg/dL — ABNORMAL HIGH (ref 0–99)
Lymphocytes Absolute: 2 10*3/uL (ref 0.7–3.1)
Lymphs: 28 %
MCH: 32 pg (ref 26.6–33.0)
MCHC: 34.6 g/dL (ref 31.5–35.7)
MCV: 92 fL (ref 79–97)
Monocytes Absolute: 0.6 10*3/uL (ref 0.1–0.9)
Monocytes: 8 %
Neutrophils Absolute: 4.3 10*3/uL (ref 1.4–7.0)
Neutrophils: 59 %
Phosphorus: 3.2 mg/dL (ref 2.8–4.1)
Platelets: 187 10*3/uL (ref 150–450)
Potassium: 4.3 mmol/L (ref 3.5–5.2)
Prostate Specific Ag, Serum: 0.8 ng/mL (ref 0.0–4.0)
RBC: 5.03 x10E6/uL (ref 4.14–5.80)
RDW: 11.9 % (ref 11.6–15.4)
Sodium: 140 mmol/L (ref 134–144)
T3 Uptake Ratio: 25 % (ref 24–39)
T4, Total: 5.4 ug/dL (ref 4.5–12.0)
TSH: 3.19 u[IU]/mL (ref 0.450–4.500)
Total Protein: 6.4 g/dL (ref 6.0–8.5)
Triglycerides: 74 mg/dL (ref 0–149)
Uric Acid: 6.2 mg/dL (ref 3.8–8.4)
VLDL Cholesterol Cal: 13 mg/dL (ref 5–40)
WBC: 7.3 10*3/uL (ref 3.4–10.8)

## 2019-07-01 ENCOUNTER — Encounter: Payer: Self-pay | Admitting: Emergency Medicine

## 2019-07-01 ENCOUNTER — Other Ambulatory Visit: Payer: Self-pay

## 2019-07-01 ENCOUNTER — Ambulatory Visit: Payer: Self-pay | Admitting: Emergency Medicine

## 2019-07-01 VITALS — BP 122/90 | HR 86 | Temp 98.0°F | Resp 12 | Wt 169.0 lb

## 2019-07-01 DIAGNOSIS — R21 Rash and other nonspecific skin eruption: Secondary | ICD-10-CM

## 2019-07-01 DIAGNOSIS — Z0000 Encounter for general adult medical examination without abnormal findings: Secondary | ICD-10-CM

## 2019-07-01 NOTE — Progress Notes (Signed)
Labs & EKG completed physical 06/19/19.  AMD

## 2019-07-01 NOTE — Progress Notes (Signed)
I have reviewed the triage vital signs and the nursing notes.   HISTORY  Chief Complaint Employment Physical Engineer, drilling) and Follow-up (Tinea Cruris)   HPI Alec Gonzales is a 39 y.o. male presents for annual physical.  Patient also was seen 2 weeks ago for a rash and has been using nystatin without any relief.  He was seen in the office where originally it was thought to be a fungal infection.      Past Medical History:  Diagnosis Date  . Elevated LDL cholesterol level   . Headache   . Hyperlipidemia   . Low testosterone   . Pericarditis   . Reactive airway disease    allergic    Patient Active Problem List   Diagnosis Date Noted  . Mild reactive airways disease 11/01/2015  . Internal derangement of left knee 10/27/2012    Past Surgical History:  Procedure Laterality Date  . RIB FRACTURE SURGERY    . TONSILLECTOMY    . VASECTOMY      Prior to Admission medications   Medication Sig Start Date End Date Taking? Authorizing Provider  albuterol (VENTOLIN HFA) 108 (90 Base) MCG/ACT inhaler Inhale 2 puffs into the lungs every 6 (six) hours as needed for wheezing or shortness of breath. 12/27/18  Yes Flora Lipps, MD  cetirizine (ZYRTEC) 10 MG tablet Take 1 tablet (10 mg total) by mouth daily. 10/11/15  Yes Kasa, Maretta Bees, MD  nystatin cream (MYCOSTATIN) Apply 1 application topically 2 (two) times daily. 06/12/19  Yes Sable Feil, PA-C  Tiotropium Bromide Monohydrate (SPIRIVA RESPIMAT) 1.25 MCG/ACT AERS Inhale 2 puffs into the lungs 2 (two) times daily. 12/27/18  Yes Kasa, Maretta Bees, MD  fluticasone furoate-vilanterol (BREO ELLIPTA) 100-25 MCG/INH AEPB Inhale 1 puff into the lungs daily. Patient not taking: Reported on 07/01/2019 12/27/18   Flora Lipps, MD  Multiple Vitamin (MULTIVITAMIN) tablet Take 1 tablet by mouth daily.    [provider]  omega-3 acid ethyl esters (LOVAZA) 1 g capsule Take 1 g by mouth daily.    [provider]  vitamin C (ASCORBIC  ACID) 500 MG tablet Take 500 mg by mouth daily.    [provider]    Allergies Prednisone  Family History  Problem Relation Age of Onset  . Hypertension Mother   . Hyperlipidemia Mother   . Hypertension Father   . Hyperlipidemia Father   . Stroke Maternal Grandfather   . Heart disease Paternal Grandfather     Social History Social History   Tobacco Use  . Smoking status: Former Smoker    Types: Cigarettes    Quit date: 08/03/2015    Years since quitting: 3.9  . Smokeless tobacco: Former Systems developer    Types: Snuff  . Tobacco comment: occ  Substance Use Topics  . Alcohol use: Yes    Comment: occ  . Drug use: No    Review of Systems Constitutional: No fever/chills Eyes: No visual changes. ENT: No sore throat. Cardiovascular: Denies chest pain. Respiratory: Denies shortness of breath. Gastrointestinal: No abdominal pain.  No nausea, no vomiting.  Genitourinary: Negative for dysuria. Musculoskeletal: Negative for back pain. Skin: Negative for rash. Neurological: Negative for headaches, focal weakness or numbness. ____________________________________________   PHYSICAL EXAM: Constitutional: Alert and oriented. Well appearing and in no acute distress. Eyes: Conjunctivae are normal. PERRL. EOMI. Head: Atraumatic. Neck: No stridor.  No cervical tenderness on palpation posteriorly. Cardiovascular: Normal rate, regular rhythm. Grossly normal heart sounds.  Good peripheral circulation. Respiratory: Normal respiratory  effort.  No retractions. Lungs CTAB. Gastrointestinal: Soft and nontender. No distention.  Bowel sounds normoactive x4 quadrants. Musculoskeletal: Nontender thoracic or lumbar spine.  Straight leg raises are negative.  Good muscle strength bilaterally.  No tenderness or difficulty with joints.  Normal gait was noted. Neurologic:  Normal speech and language. No gross focal neurologic deficits are appreciated. No gait instability. Skin:  Skin is warm, dry  and intact.  Irregular flat erythematous rash generalized in the torso, buttocks and upper thigh. Psychiatric: Mood and affect are normal. Speech and behavior are normal.  ____________________________________________   LABS Labs were discussed with patient.  EKG  Sinus rhythm with a ventricular rate of 74.  ____________________________________________   FINAL CLINICAL IMPRESSION(S)  39 year old male normal physical.  Patient also has a rash that has not improved with nystatin for 2 weeks.  He will follow up with dermatology for further evaluation of this rash.    ED Discharge Orders    None       Note:  This document was prepared using Dragon voice recognition software and may include unintentional dictation errors.

## 2019-07-16 ENCOUNTER — Other Ambulatory Visit: Payer: Self-pay | Admitting: Internal Medicine

## 2019-07-16 DIAGNOSIS — J4520 Mild intermittent asthma, uncomplicated: Secondary | ICD-10-CM

## 2019-07-16 DIAGNOSIS — R0602 Shortness of breath: Secondary | ICD-10-CM

## 2019-08-14 ENCOUNTER — Other Ambulatory Visit: Payer: Self-pay | Admitting: Internal Medicine

## 2019-08-14 DIAGNOSIS — J4520 Mild intermittent asthma, uncomplicated: Secondary | ICD-10-CM

## 2019-08-14 DIAGNOSIS — R0602 Shortness of breath: Secondary | ICD-10-CM

## 2019-12-17 ENCOUNTER — Ambulatory Visit: Payer: Self-pay | Admitting: Emergency Medicine

## 2019-12-17 ENCOUNTER — Encounter: Payer: Self-pay | Admitting: Emergency Medicine

## 2019-12-17 ENCOUNTER — Other Ambulatory Visit: Payer: Self-pay

## 2019-12-17 VITALS — BP 136/91 | HR 99 | Temp 98.2°F | Resp 16 | Ht 68.0 in | Wt 177.0 lb

## 2019-12-17 DIAGNOSIS — R5383 Other fatigue: Secondary | ICD-10-CM

## 2019-12-17 DIAGNOSIS — R6882 Decreased libido: Secondary | ICD-10-CM

## 2019-12-17 NOTE — Progress Notes (Signed)
I have reviewed the triage vital signs and the nursing notes.   HISTORY  Chief Complaint medication question  HPI Alec Gonzales is a 38 y.o. male is here with concerns of possible low testosterone.  Patient was treated sometime in the 2010 area for low testosterone with AndroGel.  He states he did not see any improvement with AndroGel and that it was more trouble than it was worth.  Patient now feels fatigued with low libido.  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  Patient states that most the time he falls asleep shortly after taking a shower which is upsetting to his spouse.     Past Medical History:  Diagnosis Date  . Elevated LDL cholesterol level   . Headache   . Hyperlipidemia   . Low testosterone   . Pericarditis   . Reactive airway disease    allergic    Patient Active Problem List   Diagnosis Date Noted  . Mild reactive airways disease 11/01/2015  . Internal derangement of left knee 10/27/2012    Past Surgical History:  Procedure Laterality Date  . RIB FRACTURE SURGERY    . TONSILLECTOMY    . VASECTOMY      Prior to Admission medications   Medication Sig Start Date End Date Taking? Authorizing Provider  albuterol (VENTOLIN HFA) 108 (90 Base) MCG/ACT inhaler Inhale 2 puffs into the lungs every 6 (six) hours as needed for wheezing or shortness of breath. 12/27/18  Yes Kasa, Wallis Bamberg, MD  BREO ELLIPTA 100-25 MCG/INH AEPB INHALE 1 PUFF BY MOUTH EVERY DAY 08/14/19  Yes Kasa, Wallis Bamberg, MD  cetirizine (ZYRTEC) 10 MG tablet Take 1 tablet (10 mg total) by mouth daily. 10/11/15  Yes Erin Fulling, MD  Multiple Vitamin (MULTIVITAMIN) tablet Take 1 tablet by mouth daily.   Yes [provider]  omega-3 acid ethyl esters (LOVAZA) 1 g capsule Take 1 g by mouth daily.   Yes [provider]  vitamin C (ASCORBIC ACID) 500 MG tablet Take 500 mg by mouth daily.   Yes [provider]   nystatin cream (MYCOSTATIN) Apply 1 application topically 2 (two) times daily. 06/12/19   Joni Reining, PA-C  Tiotropium Bromide Monohydrate (SPIRIVA RESPIMAT) 1.25 MCG/ACT AERS Inhale 2 puffs into the lungs 2 (two) times daily. 12/27/18   Erin Fulling, MD    Allergies Prednisone  Family History  Problem Relation Age of Onset  . Hypertension Mother   . Hyperlipidemia Mother   . Hypertension Father   . Hyperlipidemia Father   . Stroke Maternal Grandfather   . Heart disease Paternal Grandfather     Social History Social History   Tobacco Use  . Smoking status: Former Smoker    Types: Cigarettes    Quit date: 08/03/2015    Years since quitting: 4.3  . Smokeless tobacco: Former Neurosurgeon    Types: Snuff  . Tobacco comment: occ  Substance Use Topics  . Alcohol use: Yes    Comment: occ  . Drug use: No    Review of Systems Constitutional: No fever/chills.  Fatigue. Cardiovascular: Denies chest pain. Respiratory: Denies shortness of breath. Gastrointestinal: No abdominal pain.   Genitourinary: Decreased libido. Musculoskeletal: Negative for muscle aches. Skin: Negative for rash. Neurological: Negative for headaches, focal weakness or numbness. ____________________________________________   PHYSICAL EXAM: Constitutional: Alert and oriented. Well appearing and in no acute distress. Eyes: Conjunctivae are normal. PERRL. EOMI. Head: Atraumatic.  Neck: No stridor.  Respiratory: Normal respiratory effort.  Gastrointestinal:  No distention. Neurologic:  Normal speech and language. No gross focal neurologic deficits are appreciated. No gait instability. Skin:  Skin is warm, dry and intact. No rash noted. Psychiatric: Mood and affect are normal. Speech and behavior are normal.  ____________________________________________   LABS (all labs ordered are listed, but only abnormal results are displayed)  Testosterone panel to be  performed.   ____________________________________________   FINAL CLINICAL IMPRESSION(S)  Fatigue and decreased libido.  Will check testosterone panel before treating.   ED Discharge Orders    None       Note:  This document was prepared using Dragon voice recognition software and may include unintentional dictation errors.

## 2019-12-17 NOTE — Progress Notes (Signed)
Pt has questions on past medications. CL,RMA

## 2019-12-19 ENCOUNTER — Other Ambulatory Visit: Payer: Self-pay

## 2019-12-19 DIAGNOSIS — R6882 Decreased libido: Secondary | ICD-10-CM

## 2019-12-24 LAB — TESTOSTERONE,FREE AND TOTAL
Testosterone, Free: 12.2 pg/mL (ref 8.7–25.1)
Testosterone: 409 ng/dL (ref 264–916)

## 2019-12-25 ENCOUNTER — Ambulatory Visit: Payer: Self-pay | Admitting: Emergency Medicine

## 2019-12-25 ENCOUNTER — Other Ambulatory Visit: Payer: Self-pay

## 2019-12-25 ENCOUNTER — Encounter: Payer: Self-pay | Admitting: Emergency Medicine

## 2019-12-25 VITALS — BP 130/95 | HR 83 | Temp 97.7°F | Resp 16 | Ht 68.0 in | Wt 177.0 lb

## 2019-12-25 DIAGNOSIS — R5383 Other fatigue: Secondary | ICD-10-CM

## 2019-12-25 NOTE — Progress Notes (Signed)
   I have reviewed the triage vital signs and the nursing notes.   HISTORY  Chief Complaint lab results   HPI CHAE OOMMEN is a 39 y.o. male is here to discuss testosterone results.        Past Medical History:  Diagnosis Date  . Elevated LDL cholesterol level   . Headache   . Hyperlipidemia   . Low testosterone   . Pericarditis   . Reactive airway disease    allergic    Patient Active Problem List   Diagnosis Date Noted  . Mild reactive airways disease 11/01/2015  . Internal derangement of left knee 10/27/2012    Past Surgical History:  Procedure Laterality Date  . RIB FRACTURE SURGERY    . TONSILLECTOMY    . VASECTOMY      Prior to Admission medications   Medication Sig Start Date End Date Taking? Authorizing Provider  albuterol (VENTOLIN HFA) 108 (90 Base) MCG/ACT inhaler Inhale 2 puffs into the lungs every 6 (six) hours as needed for wheezing or shortness of breath. 12/27/18  Yes Kasa, Wallis Bamberg, MD  BREO ELLIPTA 100-25 MCG/INH AEPB INHALE 1 PUFF BY MOUTH EVERY DAY 08/14/19  Yes Kasa, Wallis Bamberg, MD  cetirizine (ZYRTEC) 10 MG tablet Take 1 tablet (10 mg total) by mouth daily. 10/11/15  Yes Erin Fulling, MD  Multiple Vitamin (MULTIVITAMIN) tablet Take 1 tablet by mouth daily.   Yes [provider]  omega-3 acid ethyl esters (LOVAZA) 1 g capsule Take 1 g by mouth daily.   Yes [provider]  Tiotropium Bromide Monohydrate (SPIRIVA RESPIMAT) 1.25 MCG/ACT AERS Inhale 2 puffs into the lungs 2 (two) times daily. 12/27/18  Yes Erin Fulling, MD  vitamin C (ASCORBIC ACID) 500 MG tablet Take 500 mg by mouth daily.   Yes [provider]    Allergies Prednisone  Family History  Problem Relation Age of Onset  . Hypertension Mother   . Hyperlipidemia Mother   . Hypertension Father   . Hyperlipidemia Father   . Stroke Maternal Grandfather   . Heart disease Paternal Grandfather     Social History Social History   Tobacco Use  . Smoking  status: Former Smoker    Types: Cigarettes    Quit date: 08/03/2015    Years since quitting: 4.3  . Smokeless tobacco: Former Neurosurgeon    Types: Snuff  . Tobacco comment: occ  Substance Use Topics  . Alcohol use: Yes    Comment: occ  . Drug use: No      LABS (all labs ordered are listed, but only abnormal results are displayed)  Discussed with patient.   FINAL CLINICAL IMPRESSION(S)   Note was sent to Dr. Sullivan Lone for his opinion based on patient's symptoms and lab work.  Patient is aware that I am waiting for further guidance on his testosterone level.  Will contact him when I get more information.   ED Discharge Orders    None       Note:  This document was prepared using Dragon voice recognition software and may include unintentional dictation errors.

## 2020-01-14 ENCOUNTER — Other Ambulatory Visit: Payer: Self-pay

## 2020-01-14 ENCOUNTER — Telehealth: Payer: Self-pay | Admitting: Nurse Practitioner

## 2020-01-14 DIAGNOSIS — H6593 Unspecified nonsuppurative otitis media, bilateral: Secondary | ICD-10-CM

## 2020-01-14 DIAGNOSIS — R112 Nausea with vomiting, unspecified: Secondary | ICD-10-CM

## 2020-01-14 MED ORDER — IBUPROFEN 600 MG PO TABS: 600.0000 mg | ORAL_TABLET | Freq: Three times a day (TID) | ORAL | 0 refills | Status: DC | PRN

## 2020-01-14 MED ORDER — ONDANSETRON 4 MG PO TBDP: 4.0000 mg | ORAL_TABLET | Freq: Three times a day (TID) | ORAL | 0 refills | Status: DC | PRN

## 2020-01-14 MED ORDER — FLUTICASONE PROPIONATE 50 MCG/ACT NA SUSP: 2.0000 | Freq: Every day | NASAL | 6 refills | Status: DC

## 2020-01-14 NOTE — Progress Notes (Signed)
Subjective:    Patient ID: Alec Gonzales, male    DOB: 13-Oct-1980, 39 y.o.   MRN: [id number]  HPI   1-2 years ago he had vertigo associated with an ear infection. He used Meclozine at that time and was seen at [facility] ED. He had a variety of testing done at that time including what appears to be tilt table/orthosatic testing patient believes he was treated for an ear infection. Symptoms resolved within 1-2 days without any ongoing issues.   Today he presents with dizziness when moving quickly for the past 2 days, head feels "swimmy" Yesterday when laying down he felt nauseated. He took meclozine last night with some relief of nausea, he is still dizzy when he stands this morning.  Gait is uneasy.   If he lays still he is OK.   Denies any recent illness, he does have allergies and has been using Zyrtec denies any recent illnesses/sinus pressure etc.   Has felt like there is "swooshing" in his ears when he moves.  Called out from [employer] today.   Denies any recent trauma.  Notes prednisone in the past has given him heart palpitations.   Review of Systems  Constitutional: Negative.   Eyes: Negative.   Respiratory: Negative.   Cardiovascular: Negative.   Neurological: Positive for dizziness.   BP 126/80 after repeated assessment in clinic. Pulse 85      Objective:   Physical Exam Constitutional:      Appearance: He is ill-appearing.  HENT:     Right Ear: Hearing and ear canal normal. A middle ear effusion is present. Tympanic membrane is not injected.     Left Ear: Hearing and ear canal normal. A middle ear effusion is present. Tympanic membrane is not injected.  Cardiovascular:     Rate and Rhythm: Normal rate.  Neurological:     Mental Status: He is alert.      Patient was initially seen virtually and was then asked to come by the office for brief visit for BP check and ear exam.    Unable to review ER visit for prior vertigo episode-not available through  EMR.      Assessment & Plan:  No driving until symptoms resolve, or operating machinery. Patient is off from work tomorrow at [employer], does not require note for missing today. Will RTC if symptoms have not resolved prior to return to work date.   1. Continue Zyrtec and start flonase to help relieve fluid middle up in middle ear 2. Ibuprofen to help with inflammatory nature of disease process, prednisone deferred given patient's history of palpitations while taking. If symptoms persist in the absence of infection may attempt reduced does of oral steroids.  3. Zofran as needed for nausea.   For persistent symptoms consider ENT referral.   Meds ordered this encounter  Medications  . ondansetron (ZOFRAN ODT) 4 MG disintegrating tablet    Sig: Take 1 tablet (4 mg total) by mouth every 8 (eight) hours as needed for nausea or vomiting.    Dispense:  30 tablet    Refill:  0  . ibuprofen (ADVIL) 600 MG tablet    Sig: Take 1 tablet (600 mg total) by mouth every 8 (eight) hours as needed (take with food).    Dispense:  30 tablet    Refill:  0  . fluticasone (FLONASE) 50 MCG/ACT nasal spray    Sig: Place 2 sprays into both nostrils daily.    Dispense:  16  g    Refill:  6

## 2020-01-15 ENCOUNTER — Other Ambulatory Visit: Payer: Self-pay | Admitting: Physician Assistant

## 2020-01-15 DIAGNOSIS — H8110 Benign paroxysmal vertigo, unspecified ear: Secondary | ICD-10-CM

## 2020-01-15 MED ORDER — MECLIZINE HCL 25 MG PO TABS: 25.0000 mg | ORAL_TABLET | Freq: Three times a day (TID) | ORAL | 0 refills | Status: AC | PRN

## 2020-01-15 MED ORDER — MECLIZINE HCL 12.5 MG PO TABS: 25.0000 mg | ORAL_TABLET | Freq: Three times a day (TID) | ORAL | Status: AC

## 2020-01-15 NOTE — Progress Notes (Signed)
an

## 2020-01-15 NOTE — Progress Notes (Signed)
Telephonic encounter with patient complaint of vertigo.  Patient onset of complaint was 3 days ago.  Patient state complaint increases with change of positions.  Patient states no relief with Flonase, ibuprofen, and Zyrtec.  Recommend discontinue Zyrtec and start taking Antivert at 25 mg 3 times a day for the next 5 to 7 days.  If no improvement over the weekend to consult ENT will be generated.

## 2020-01-16 ENCOUNTER — Other Ambulatory Visit: Payer: Self-pay

## 2020-01-16 DIAGNOSIS — H8110 Benign paroxysmal vertigo, unspecified ear: Secondary | ICD-10-CM

## 2020-01-16 DIAGNOSIS — H8111 Benign paroxysmal vertigo, right ear: Secondary | ICD-10-CM | POA: Diagnosis not present

## 2020-01-16 DIAGNOSIS — H6983 Other specified disorders of Eustachian tube, bilateral: Secondary | ICD-10-CM | POA: Diagnosis not present

## 2020-01-16 DIAGNOSIS — J301 Allergic rhinitis due to pollen: Secondary | ICD-10-CM | POA: Diagnosis not present

## 2020-01-16 NOTE — Progress Notes (Signed)
Stat referral to [facility] ENT.  Called to notify of referral

## 2020-01-19 ENCOUNTER — Other Ambulatory Visit: Payer: Self-pay

## 2020-01-19 ENCOUNTER — Encounter: Payer: Self-pay | Admitting: Physician Assistant

## 2020-01-19 ENCOUNTER — Ambulatory Visit: Payer: Self-pay | Admitting: Physician Assistant

## 2020-01-19 VITALS — BP 132/100 | HR 97 | Temp 98.8°F | Resp 16 | Ht 68.0 in | Wt 175.0 lb

## 2020-01-19 DIAGNOSIS — H8110 Benign paroxysmal vertigo, unspecified ear: Secondary | ICD-10-CM

## 2020-01-19 NOTE — Progress Notes (Signed)
Pt states he can still hear his heart beat in his ears (ear Pressure) but the dizziness has gotten better.Alec Gonzales.

## 2020-01-19 NOTE — Progress Notes (Signed)
   Subjective: Vertigo    Patient ID: Alec Gonzales, male    DOB: [birth year], 39 y.o.   MRN: [id number]  HPI Patient presents for reevaluation of vertigo.  Patient was seen by ENT clinic 3 days ago and state that therapy has decreased his vertigo.  Patient continues take Antivert as directed.  Denies nausea or vision disturbance.   Review of Systems    Negative set for complaint. Objective:   Physical Exam No acute distress.  Patient with normal gait.  HEENT is unremarkable.  Patient has negative Romberg.       Assessment & Plan: Resolving vertigo  Patient advised with continued therapy prescribed by ENT clinic.  Continue taking Antivert for the next 2 days.  Patient given return to work clearance.  Return back if condition recurs.

## 2020-02-12 DIAGNOSIS — H6983 Other specified disorders of Eustachian tube, bilateral: Secondary | ICD-10-CM | POA: Diagnosis not present

## 2020-02-12 DIAGNOSIS — H8111 Benign paroxysmal vertigo, right ear: Secondary | ICD-10-CM | POA: Diagnosis not present

## 2020-06-14 DIAGNOSIS — Z0100 Encounter for examination of eyes and vision without abnormal findings: Secondary | ICD-10-CM | POA: Diagnosis not present

## 2020-06-24 NOTE — Progress Notes (Signed)
Scheduled to complete physical 07/13/20 - provider TBD.  AMD

## 2020-06-28 ENCOUNTER — Other Ambulatory Visit: Payer: Self-pay

## 2020-06-28 ENCOUNTER — Ambulatory Visit: Payer: Self-pay

## 2020-06-28 DIAGNOSIS — Z0000 Encounter for general adult medical examination without abnormal findings: Secondary | ICD-10-CM

## 2020-06-28 LAB — POCT URINALYSIS DIPSTICK
Bilirubin, UA: NEGATIVE
Blood, UA: NEGATIVE
Glucose, UA: NEGATIVE
Ketones, UA: NEGATIVE
Leukocytes, UA: NEGATIVE
Nitrite, UA: NEGATIVE
Protein, UA: NEGATIVE
Spec Grav, UA: 1.015 (ref 1.010–1.025)
Urobilinogen, UA: 0.2 E.U./dL
pH, UA: 6 (ref 5.0–8.0)

## 2020-06-29 LAB — CMP12+LP+TP+TSH+6AC+PSA+CBC…
ALT: 37 IU/L (ref 0–44)
AST: 25 IU/L (ref 0–40)
Albumin/Globulin Ratio: 2 (ref 1.2–2.2)
Albumin: 4.5 g/dL (ref 4.0–5.0)
Alkaline Phosphatase: 60 IU/L (ref 44–121)
BUN/Creatinine Ratio: 13 (ref 9–20)
BUN: 13 mg/dL (ref 6–20)
Basophils Absolute: 0.1 10*3/uL (ref 0.0–0.2)
Basos: 1 %
Bilirubin Total: 0.6 mg/dL (ref 0.0–1.2)
Calcium: 9.8 mg/dL (ref 8.7–10.2)
Chloride: 101 mmol/L (ref 96–106)
Chol/HDL Ratio: 7.6 ratio — ABNORMAL HIGH (ref 0.0–5.0)
Cholesterol, Total: 260 mg/dL — ABNORMAL HIGH (ref 100–199)
Creatinine, Ser: 1.01 mg/dL (ref 0.76–1.27)
EOS (ABSOLUTE): 0.4 10*3/uL (ref 0.0–0.4)
Eos: 4 %
Estimated CHD Risk: 1.6 times avg. — ABNORMAL HIGH (ref 0.0–1.0)
Free Thyroxine Index: 1.5 (ref 1.2–4.9)
GGT: 23 IU/L (ref 0–65)
Globulin, Total: 2.2 g/dL (ref 1.5–4.5)
Glucose: 82 mg/dL (ref 65–99)
HDL: 34 mg/dL — ABNORMAL LOW (ref >39)
Hematocrit: 50.6 % (ref 37.5–51.0)
Hemoglobin: 17.5 g/dL (ref 13.0–17.7)
Immature Grans (Abs): 0.1 10*3/uL (ref 0.0–0.1)
Immature Granulocytes: 1 %
Iron: 81 ug/dL (ref 38–169)
LDH: 184 IU/L (ref 121–224)
LDL Chol Calc (NIH): 182 mg/dL — ABNORMAL HIGH (ref 0–99)
Lymphocytes Absolute: 2.6 10*3/uL (ref 0.7–3.1)
Lymphs: 29 %
MCH: 31.8 pg (ref 26.6–33.0)
MCHC: 34.6 g/dL (ref 31.5–35.7)
MCV: 92 fL (ref 79–97)
Monocytes Absolute: 0.8 10*3/uL (ref 0.1–0.9)
Monocytes: 8 %
Neutrophils Absolute: 5.2 10*3/uL (ref 1.4–7.0)
Neutrophils: 57 %
Phosphorus: 3.2 mg/dL (ref 2.8–4.1)
Platelets: 226 10*3/uL (ref 150–450)
Potassium: 4.6 mmol/L (ref 3.5–5.2)
Prostate Specific Ag, Serum: 0.8 ng/mL (ref 0.0–4.0)
RBC: 5.51 x10E6/uL (ref 4.14–5.80)
RDW: 11.9 % (ref 11.6–15.4)
Sodium: 139 mmol/L (ref 134–144)
T3 Uptake Ratio: 24 % (ref 24–39)
T4, Total: 6.3 ug/dL (ref 4.5–12.0)
TSH: 3.47 u[IU]/mL (ref 0.450–4.500)
Total Protein: 6.7 g/dL (ref 6.0–8.5)
Triglycerides: 230 mg/dL — ABNORMAL HIGH (ref 0–149)
Uric Acid: 5.4 mg/dL (ref 3.8–8.4)
VLDL Cholesterol Cal: 44 mg/dL — ABNORMAL HIGH (ref 5–40)
WBC: 9 10*3/uL (ref 3.4–10.8)
eGFR: 98 mL/min/{1.73_m2} (ref >59)

## 2020-07-13 ENCOUNTER — Ambulatory Visit: Payer: Self-pay | Admitting: Physician Assistant

## 2020-07-13 ENCOUNTER — Encounter: Payer: Self-pay | Admitting: Physician Assistant

## 2020-07-13 ENCOUNTER — Other Ambulatory Visit: Payer: Self-pay

## 2020-07-13 VITALS — HR 106 | Temp 97.8°F | Resp 14 | Ht 68.0 in | Wt 175.0 lb

## 2020-07-13 DIAGNOSIS — Z0000 Encounter for general adult medical examination without abnormal findings: Secondary | ICD-10-CM

## 2020-07-13 MED ORDER — ROSUVASTATIN CALCIUM 40 MG PO TABS: 40.0000 mg | ORAL_TABLET | Freq: Every day | ORAL | 3 refills | Status: DC

## 2020-07-13 MED ORDER — ALPRAZOLAM 1 MG PO TABS: 1.0000 mg | ORAL_TABLET | ORAL | 0 refills | Status: AC

## 2020-07-13 NOTE — Progress Notes (Signed)
   Subjective: Annual firefighter exam    Patient ID: Alec Gonzales, male    DOB: [birth year], 40 y.o.   MRN: [id number]  HPI Patient presents for annual firefighter exam.  Patient requests antianxiety medicine secondary to forthcoming flight. Patient has history anxiety with flying. Review of Systems Negative    Objective:   Physical Exam No acute distress.  Temperature 97.8 pulse 106, respiration 14, patient 95% O2 sat on room air.  Blood pressure readings will be reassessed pending 3-day blood pressure check. HEENT is unremarkable.  Neck is supple adenectomy or bruits.  Lungs are clear to auscultation.  Heart regular rate and rhythm. Abdomen with negative HSM, normoactive bowel sounds, soft, and nontender to palpation. No obvious deformity to the upper or lower extremities.  Patient has full and equal range of motion of the upper and lower extremities. No cervical or lumbar spine deformity.  Patient has full and equal range of motion cervical lumbar spine. Cranial nerves II through XII grossly intact.       Assessment & Plan: Well exam  Patient blood pressure is mildly elevated with no prior history of hypertension.  We will follow-up status post 3-day blood pressure check.  Discussed lab results with patient showed hyperlipidemia that has increased in the past year.  Patient will start Crestor and have a reevaluation in 3 months.  Patient given prescription for 1 mg Xanax (2 tablets).  Advised to take 1 hour before flight.  Advised on drowsy effects of medication.  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

## 2020-07-14 ENCOUNTER — Ambulatory Visit: Payer: Self-pay

## 2020-07-14 VITALS — BP 120/90

## 2020-07-14 DIAGNOSIS — Z0130 Encounter for examination of blood pressure without abnormal findings: Secondary | ICD-10-CM

## 2020-07-14 NOTE — Progress Notes (Signed)
Pt presented today for another bp check. Pt will return tomorrow for another recheck and Friday as well. Pt will be scheduled to come in Monday to see the provider. CL,RMA

## 2020-07-15 ENCOUNTER — Ambulatory Visit: Payer: Self-pay

## 2020-07-15 VITALS — BP 135/91 | HR 90

## 2020-07-15 DIAGNOSIS — Z0130 Encounter for examination of blood pressure without abnormal findings: Secondary | ICD-10-CM

## 2020-07-15 NOTE — Progress Notes (Signed)
Pt presents today for 2nd BP check. Pt returns tomorrow for 3rd reading before seeing [facility] Monday. CL,RMA

## 2020-07-16 ENCOUNTER — Other Ambulatory Visit: Payer: Self-pay

## 2020-07-16 ENCOUNTER — Ambulatory Visit: Payer: Self-pay

## 2020-07-16 VITALS — BP 130/90

## 2020-07-16 DIAGNOSIS — Z0130 Encounter for examination of blood pressure without abnormal findings: Secondary | ICD-10-CM

## 2020-07-16 NOTE — Progress Notes (Signed)
Pt presented today to have 3rd bp check. Pt scheduled Monday to see provider. CL,RMA

## 2020-07-19 ENCOUNTER — Ambulatory Visit: Payer: Self-pay | Admitting: Physician Assistant

## 2020-07-19 ENCOUNTER — Other Ambulatory Visit: Payer: Self-pay

## 2020-07-19 ENCOUNTER — Encounter: Payer: Self-pay | Admitting: Physician Assistant

## 2020-07-19 VITALS — BP 145/108 | HR 72 | Temp 98.0°F | Resp 12 | Ht 68.0 in | Wt 180.0 lb

## 2020-07-19 DIAGNOSIS — I10 Essential (primary) hypertension: Secondary | ICD-10-CM

## 2020-07-19 MED ORDER — HYDROCHLOROTHIAZIDE 25 MG PO TABS: 25.0000 mg | ORAL_TABLET | Freq: Every day | ORAL | 3 refills | Status: DC

## 2020-07-19 NOTE — Progress Notes (Signed)
   Subjective: Hypertension    Patient ID: Alec Gonzales, male    DOB: 1980/07/18, 40 y.o.   MRN: [id number]  HPI Patient follow-up status post 3-day blood pressure check secondary to elevated readings on initial physical exam. There is a strong family history hyperlipidemia and hypertension.  Review of Systems    Hyperlipidemia Objective:   Physical Exam No acute distress.  Temperature is 98.0.  Pulse 72.  Respiration 12.  BP is 150/100 which was taken manually at the machine showed 145/108. Neck is supple for adenopathy or bruits.  Lungs are clear to auscultation.  Heart is regular rate and rhythm.       Assessment & Plan: Hypertension  Patient will start HCTZ at 25 mg daily and follow-up in 2 weeks with a 3-day blood pressure check.  Return back to clinic if condition worsen before scheduled follow-up.

## 2020-07-19 NOTE — Progress Notes (Signed)
Manual BP Recheck = 150/100  AMD 

## 2020-07-20 ENCOUNTER — Other Ambulatory Visit: Payer: Self-pay | Admitting: Nurse Practitioner

## 2020-07-20 NOTE — Progress Notes (Signed)
Spoke with patient over the phone he woke up at 6am today and started HCTZ that was ordered yesterday.   Has been feeling a little "off" denies dizziness or being lightheaded.   Also started Crestor 3 days ago.   Unable to specify how exactly he is feeling different. Has had 3 bottles of water.   Instructed to drink a gatoraid and report to [facility] for BP check today.

## 2020-07-21 ENCOUNTER — Other Ambulatory Visit: Payer: Self-pay

## 2020-07-21 ENCOUNTER — Ambulatory Visit: Payer: Self-pay

## 2020-07-21 VITALS — BP 130/100 | HR 76

## 2020-07-21 DIAGNOSIS — Z0130 Encounter for examination of blood pressure without abnormal findings: Secondary | ICD-10-CM

## 2020-07-23 ENCOUNTER — Telehealth: Payer: Self-pay

## 2020-07-23 NOTE — Telephone Encounter (Signed)
Call placed to Alec Gonzales to follow up on earlier reports of not feeling well on 07/19/20. [name] reports "things have leveled out" and he is feeling back to normal. We reviewed ongoing lifestyle / diet changes for BP management. [name] has removed caffeine from his daily intake outside of 1 can of Pepsi per day and is reducing salt intake and fast food choices while managing a busy schedule. [name] will continue to monitor and document BP and follow up with his scheduled appointment here next week.

## 2020-08-03 ENCOUNTER — Ambulatory Visit: Payer: 59 | Admitting: Nurse Practitioner

## 2020-09-16 ENCOUNTER — Ambulatory Visit: Payer: Self-pay

## 2020-09-16 ENCOUNTER — Other Ambulatory Visit: Payer: Self-pay

## 2020-09-16 VITALS — BP 124/88

## 2020-09-16 DIAGNOSIS — I10 Essential (primary) hypertension: Secondary | ICD-10-CM

## 2020-09-16 NOTE — Progress Notes (Signed)
Gavyn "Alec Gonzales" Santana reports he checks BP daily. He reports SBP readings running 130s, DBP running 90s-100 range. Today his BP was checked manually and recorded as 124/88. He will return for BP reading again next week and track his personal readings. If DBP continues to be elevated 90-100 range, Alec Gonzales has been advised to make follow up appointment with provider.

## 2020-10-19 ENCOUNTER — Ambulatory Visit: Payer: Self-pay

## 2020-10-19 ENCOUNTER — Other Ambulatory Visit: Payer: Self-pay

## 2020-10-19 VITALS — BP 134/88 | HR 76

## 2020-10-19 DIAGNOSIS — Z0130 Encounter for examination of blood pressure without abnormal findings: Secondary | ICD-10-CM

## 2020-10-19 NOTE — Progress Notes (Signed)
Pt presents today for BP check. 

## 2020-10-28 ENCOUNTER — Ambulatory Visit: Payer: Self-pay

## 2020-10-28 ENCOUNTER — Other Ambulatory Visit: Payer: Self-pay

## 2020-10-28 VITALS — BP 138/96

## 2020-10-28 DIAGNOSIS — Z0130 Encounter for examination of blood pressure without abnormal findings: Secondary | ICD-10-CM

## 2020-10-28 NOTE — Progress Notes (Signed)
Pt BP slight elevated. Pt states he hasn't took his medication yet. CL,RMA

## 2020-12-13 ENCOUNTER — Other Ambulatory Visit: Payer: 59

## 2020-12-15 ENCOUNTER — Other Ambulatory Visit: Payer: Self-pay

## 2020-12-15 DIAGNOSIS — R6882 Decreased libido: Secondary | ICD-10-CM

## 2020-12-15 DIAGNOSIS — E785 Hyperlipidemia, unspecified: Secondary | ICD-10-CM

## 2020-12-15 DIAGNOSIS — R5383 Other fatigue: Secondary | ICD-10-CM

## 2020-12-15 NOTE — Progress Notes (Addendum)
Billey Gosling collected necessary labs./CL,RMA

## 2020-12-17 LAB — CMP12+LP+TP+TSH+6AC+CBC/D/PLT
ALT: 30 IU/L (ref 0–44)
AST: 30 IU/L (ref 0–40)
Albumin/Globulin Ratio: 2.5 — ABNORMAL HIGH (ref 1.2–2.2)
Albumin: 4.9 g/dL (ref 4.0–5.0)
Alkaline Phosphatase: 47 IU/L (ref 44–121)
BUN/Creatinine Ratio: 22 — ABNORMAL HIGH (ref 9–20)
BUN: 25 mg/dL — ABNORMAL HIGH (ref 6–20)
Basophils Absolute: 0.1 10*3/uL (ref 0.0–0.2)
Basos: 1 %
Bilirubin Total: 0.8 mg/dL (ref 0.0–1.2)
Calcium: 9.8 mg/dL (ref 8.7–10.2)
Chloride: 102 mmol/L (ref 96–106)
Chol/HDL Ratio: 4.8 ratio (ref 0.0–5.0)
Cholesterol, Total: 228 mg/dL — ABNORMAL HIGH (ref 100–199)
Creatinine, Ser: 1.14 mg/dL (ref 0.76–1.27)
EOS (ABSOLUTE): 0.2 10*3/uL (ref 0.0–0.4)
Eos: 3 %
Estimated CHD Risk: 1 times avg. (ref 0.0–1.0)
Free Thyroxine Index: 2.1 (ref 1.2–4.9)
GGT: 15 IU/L (ref 0–65)
Globulin, Total: 2 g/dL (ref 1.5–4.5)
Glucose: 83 mg/dL (ref 70–99)
HDL: 48 mg/dL (ref >39)
Hematocrit: 49.5 % (ref 37.5–51.0)
Hemoglobin: 17.1 g/dL (ref 13.0–17.7)
Immature Grans (Abs): 0 10*3/uL (ref 0.0–0.1)
Immature Granulocytes: 0 %
Iron: 52 ug/dL (ref 38–169)
LDH: 176 IU/L (ref 121–224)
LDL Chol Calc (NIH): 165 mg/dL — ABNORMAL HIGH (ref 0–99)
Lymphocytes Absolute: 2.1 10*3/uL (ref 0.7–3.1)
Lymphs: 28 %
MCH: 31.2 pg (ref 26.6–33.0)
MCHC: 34.5 g/dL (ref 31.5–35.7)
MCV: 90 fL (ref 79–97)
Monocytes Absolute: 0.6 10*3/uL (ref 0.1–0.9)
Monocytes: 8 %
Neutrophils Absolute: 4.5 10*3/uL (ref 1.4–7.0)
Neutrophils: 60 %
Phosphorus: 2.5 mg/dL — ABNORMAL LOW (ref 2.8–4.1)
Platelets: 209 10*3/uL (ref 150–450)
Potassium: 4.2 mmol/L (ref 3.5–5.2)
RBC: 5.48 x10E6/uL (ref 4.14–5.80)
RDW: 11.5 % — ABNORMAL LOW (ref 11.6–15.4)
Sodium: 140 mmol/L (ref 134–144)
T3 Uptake Ratio: 30 % (ref 24–39)
T4, Total: 7.1 ug/dL (ref 4.5–12.0)
TSH: 3.94 u[IU]/mL (ref 0.450–4.500)
Total Protein: 6.9 g/dL (ref 6.0–8.5)
Triglycerides: 85 mg/dL (ref 0–149)
Uric Acid: 7.6 mg/dL (ref 3.8–8.4)
VLDL Cholesterol Cal: 15 mg/dL (ref 5–40)
WBC: 7.5 10*3/uL (ref 3.4–10.8)
eGFR: 84 mL/min/{1.73_m2} (ref >59)

## 2020-12-17 LAB — C-REACTIVE PROTEIN: CRP: 1 mg/L (ref 0–10)

## 2020-12-17 LAB — TESTOSTERONE,FREE AND TOTAL
Testosterone, Free: 11.8 pg/mL (ref 8.7–25.1)
Testosterone: 514 ng/dL (ref 264–916)

## 2020-12-17 LAB — HGB A1C W/O EAG: Hgb A1c MFr Bld: 5.3 % (ref 4.8–5.6)

## 2021-02-14 ENCOUNTER — Ambulatory Visit: Payer: Self-pay | Admitting: Physician Assistant

## 2021-02-14 ENCOUNTER — Encounter: Payer: Self-pay | Admitting: Physician Assistant

## 2021-02-14 VITALS — BP 136/84 | HR 68 | Temp 98.2°F | Resp 14 | Ht 68.0 in | Wt 152.0 lb

## 2021-02-14 DIAGNOSIS — Z1152 Encounter for screening for COVID-19: Secondary | ICD-10-CM

## 2021-02-14 LAB — POC COVID19 BINAXNOW: SARS Coronavirus 2 Ag: NEGATIVE

## 2021-02-14 MED ORDER — PSEUDOEPH-BROMPHEN-DM 30-2-10 MG/5ML PO SYRP: 5.0000 mL | ORAL_SOLUTION | Freq: Four times a day (QID) | ORAL | 0 refills | Status: DC | PRN

## 2021-02-14 MED ORDER — ALPRAZOLAM 1 MG PO TABS: 1.0000 mg | ORAL_TABLET | Freq: Every day | ORAL | 0 refills | Status: DC

## 2021-02-14 NOTE — Progress Notes (Signed)
Pt presents today for Possible sinus infection. PT tested negative in car with RAPID COVID.  States has been off BP since September when he started new eating plan. Has lost 25 lbs  S/Sx 2 1/2 weeks - only thing left is nagging cough & chest congestion Cough keeps him awake at night. Currently using Hall's cough drops & Alkaseltzer Requesting something for the cough  Also, upcoming flight  to [place] Rx med for flight - doesn't do well flying. In the past he was prescribed Xanax 1.0 mg  AMD

## 2021-02-14 NOTE — Progress Notes (Signed)
   Subjective: Sinus congestion and cough    Patient ID: Alec Gonzales, male    DOB: 1980-10-29, 40 y.o.   MRN: [id number]  HPI Patient complain 2 weeks of sinus congestion, postnasal drainage, and cough.  Patient denies fever or chills.  Patient denies facial pain or ear pressure.  Patient tested negative for COVID-19 x3 in the past 3 days.  Patient is planning a trip to [facility] this weekend.  Patient has anxiety air flights.  Requesting 2 tabs of Xanax as prescribed  prior to l airflight..   Review of Systems Hyperlipidemia and seasonal rhinitis    Objective:   Physical Exam No acute distress.  HEENT normal for edematous nasal turbinates and postnasal drainage.  Neck is supple follow-up anatomy bruits.  Lungs are clear to auscultation.        Assessment & Plan: Anxiety, cough, and nasal congestion   Patient given prescription for Bromfed-DM.  Patient prescribed 1 mg of Xanax x2 to take for departure and arriving flights.

## 2021-02-15 LAB — POCT INFLUENZA A/B
Influenza A, POC: NEGATIVE
Influenza B, POC: NEGATIVE

## 2021-06-05 IMAGING — CR DG CHEST 2V
1 series · 2 of 2 positions shown · non-contrast
Comparison: None.

CLINICAL DATA: Shortness of breath

EXAM:
CHEST - 2 VIEW

[Series 1: dg chest 2 view · 0.14mm/px · 2 of 2 slices shown]
[im 1/2]
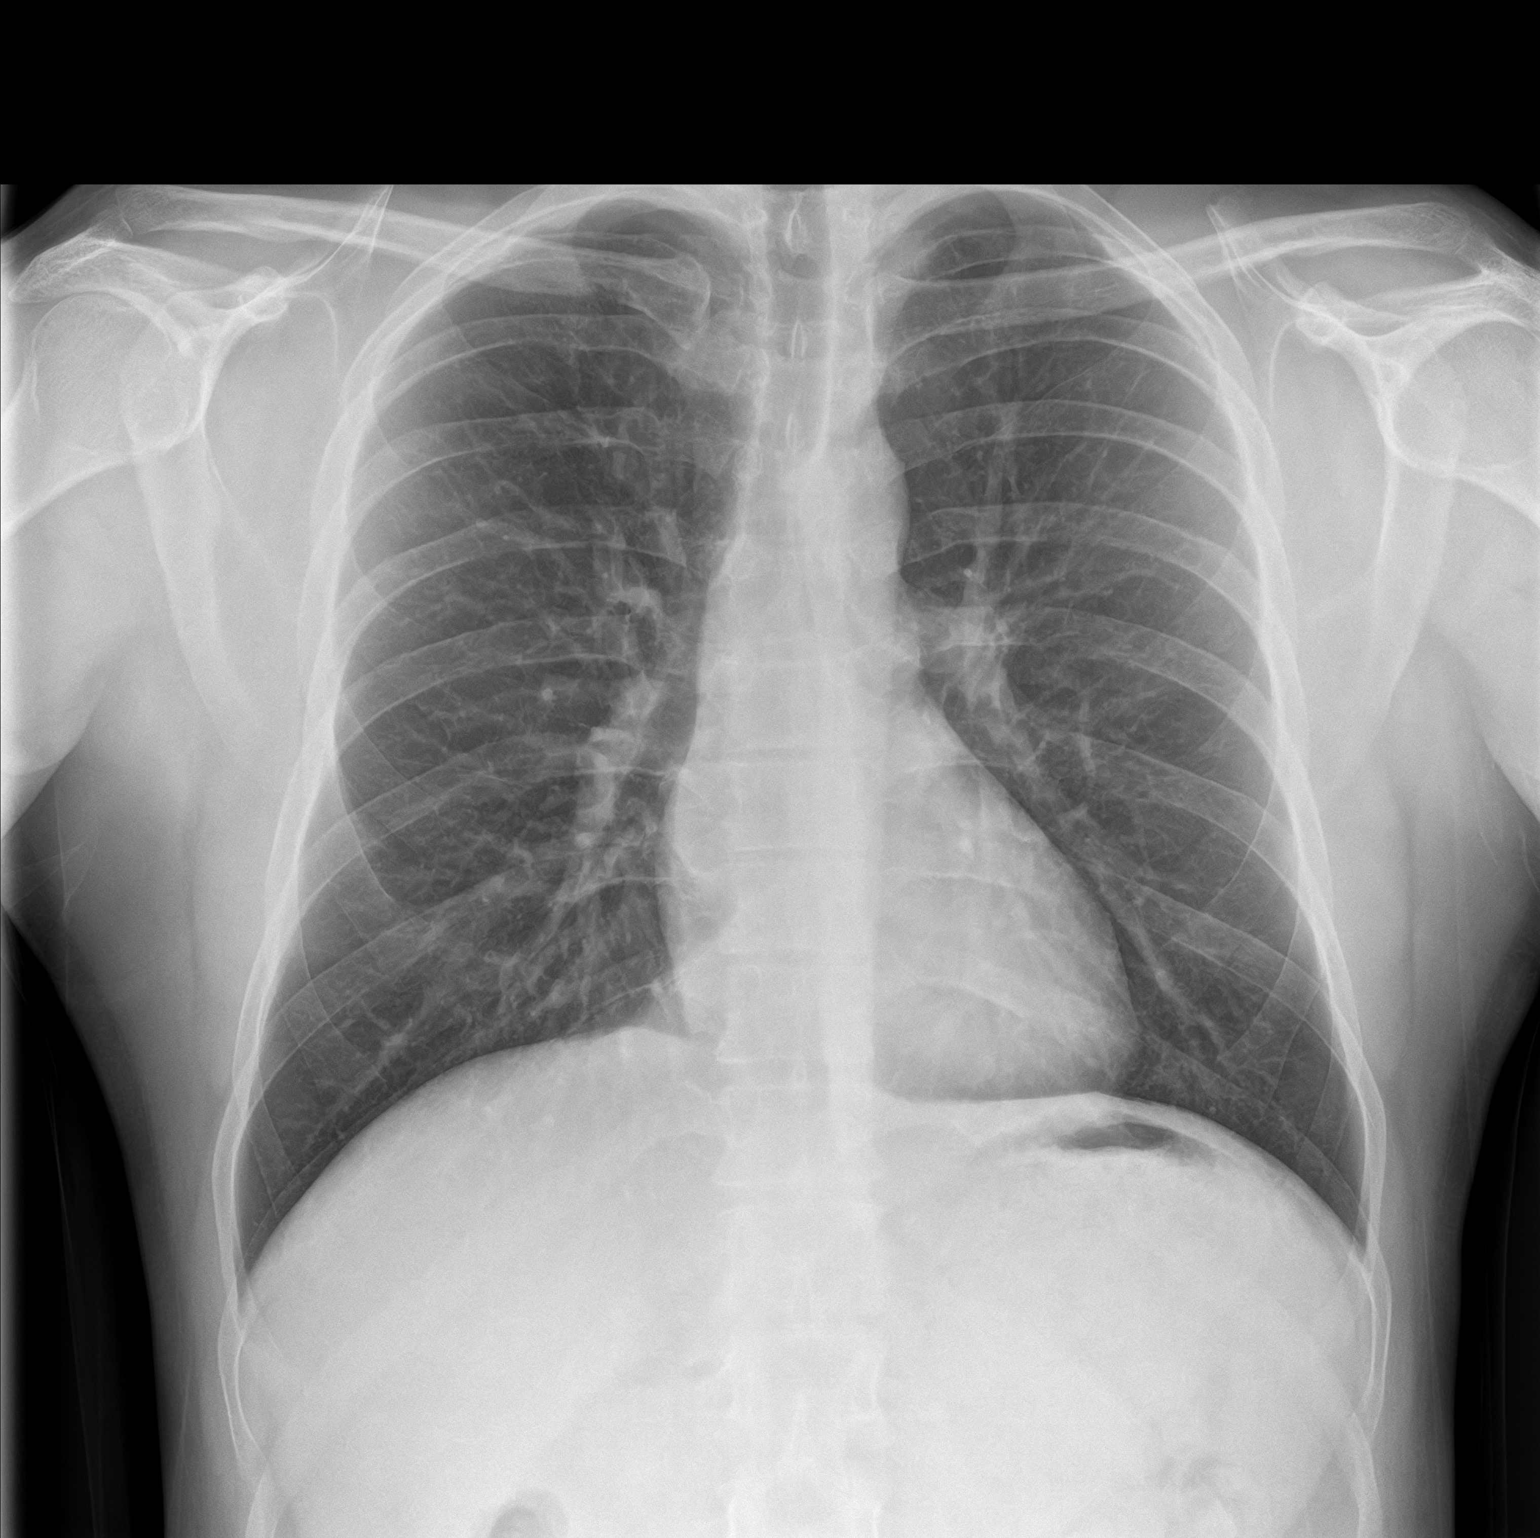
[im 2/2]
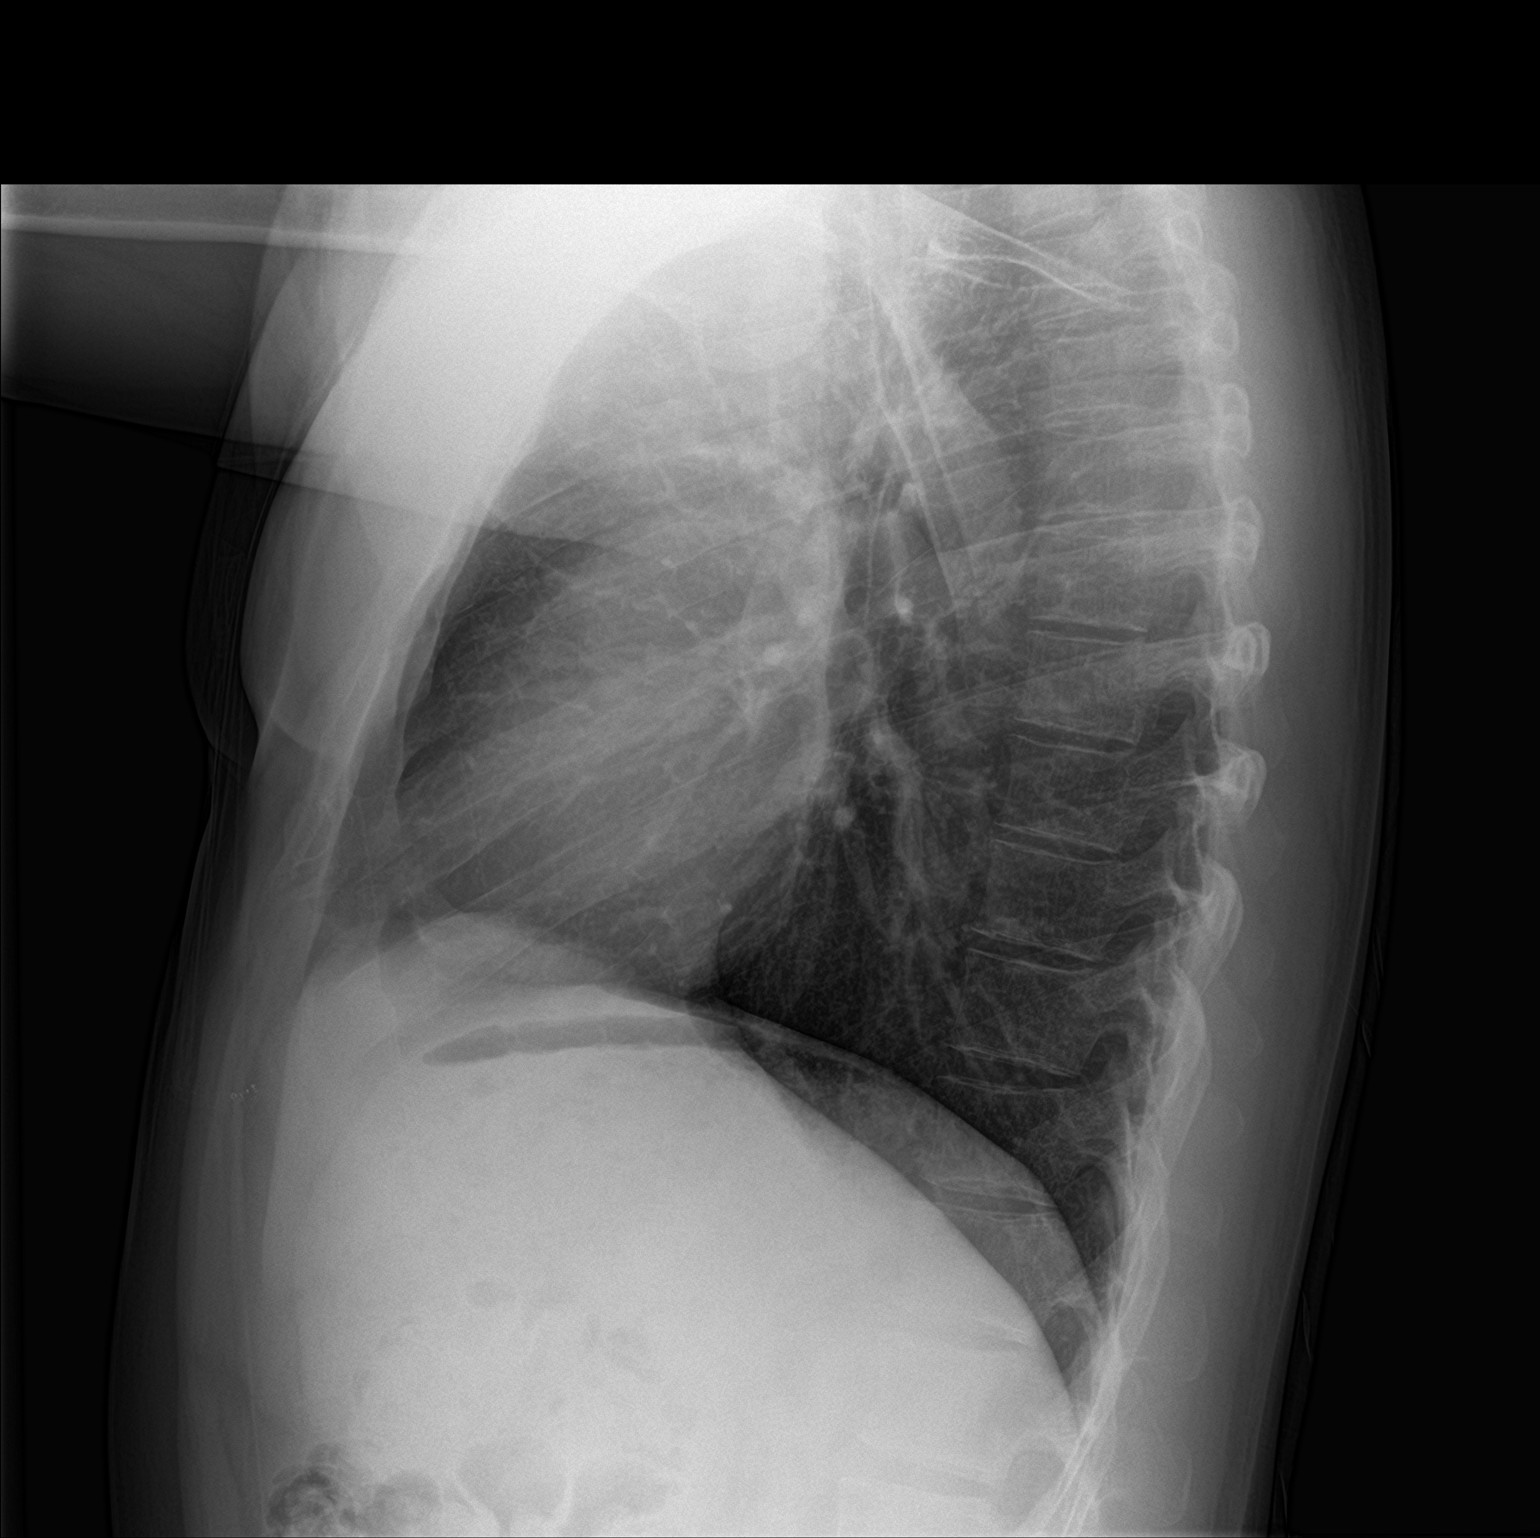

[2 of 2 positions shown; findings below may reference images not displayed]

FINDINGS: The heart size and mediastinal contours are within normal limits.
Both lungs are clear. The visualized skeletal structures are
unremarkable.
IMPRESSION: No active cardiopulmonary disease.

## 2021-06-27 NOTE — Progress Notes (Deleted)
Pt presents today for physical labs, will return to clinic for scheduled physical.  

## 2021-07-03 DIAGNOSIS — M940 Chondrocostal junction syndrome [Tietze]: Secondary | ICD-10-CM | POA: Diagnosis not present

## 2021-07-03 DIAGNOSIS — R079 Chest pain, unspecified: Secondary | ICD-10-CM | POA: Diagnosis not present

## 2021-07-05 ENCOUNTER — Ambulatory Visit: Payer: 59 | Admitting: Physician Assistant

## 2021-07-11 ENCOUNTER — Ambulatory Visit: Payer: Self-pay

## 2021-07-11 DIAGNOSIS — Z0289 Encounter for other administrative examinations: Secondary | ICD-10-CM

## 2021-07-11 LAB — POCT URINALYSIS DIPSTICK
Bilirubin, UA: NEGATIVE
Blood, UA: NEGATIVE
Glucose, UA: NEGATIVE
Ketones, UA: NEGATIVE
Leukocytes, UA: NEGATIVE
Nitrite, UA: NEGATIVE
Protein, UA: NEGATIVE
Spec Grav, UA: 1.02 (ref 1.010–1.025)
Urobilinogen, UA: 0.2 E.U./dL
pH, UA: 6.5 (ref 5.0–8.0)

## 2021-07-12 LAB — CMP12+LP+TP+TSH+6AC+CBC/D/PLT
ALT: 19 IU/L (ref 0–44)
AST: 19 IU/L (ref 0–40)
Albumin/Globulin Ratio: 2.4 — ABNORMAL HIGH (ref 1.2–2.2)
Albumin: 4.4 g/dL (ref 4.0–5.0)
Alkaline Phosphatase: 50 IU/L (ref 44–121)
BUN/Creatinine Ratio: 21 — ABNORMAL HIGH (ref 9–20)
BUN: 19 mg/dL (ref 6–20)
Basophils Absolute: 0.1 10*3/uL (ref 0.0–0.2)
Basos: 1 %
Bilirubin Total: 1 mg/dL (ref 0.0–1.2)
Calcium: 9.3 mg/dL (ref 8.7–10.2)
Chloride: 105 mmol/L (ref 96–106)
Chol/HDL Ratio: 5.1 ratio — ABNORMAL HIGH (ref 0.0–5.0)
Cholesterol, Total: 259 mg/dL — ABNORMAL HIGH (ref 100–199)
Creatinine, Ser: 0.9 mg/dL (ref 0.76–1.27)
EOS (ABSOLUTE): 0.3 10*3/uL (ref 0.0–0.4)
Eos: 5 %
Estimated CHD Risk: 1.1 times avg. — ABNORMAL HIGH (ref 0.0–1.0)
Free Thyroxine Index: 1.4 (ref 1.2–4.9)
GGT: 11 IU/L (ref 0–65)
Globulin, Total: 1.8 g/dL (ref 1.5–4.5)
Glucose: 87 mg/dL (ref 70–99)
HDL: 51 mg/dL (ref >39)
Hematocrit: 46.7 % (ref 37.5–51.0)
Hemoglobin: 15.9 g/dL (ref 13.0–17.7)
Immature Grans (Abs): 0 10*3/uL (ref 0.0–0.1)
Immature Granulocytes: 0 %
Iron: 126 ug/dL (ref 38–169)
LDH: 141 IU/L (ref 121–224)
LDL Chol Calc (NIH): 193 mg/dL — ABNORMAL HIGH (ref 0–99)
Lymphocytes Absolute: 1.9 10*3/uL (ref 0.7–3.1)
Lymphs: 29 %
MCH: 31.1 pg (ref 26.6–33.0)
MCHC: 34 g/dL (ref 31.5–35.7)
MCV: 91 fL (ref 79–97)
Monocytes Absolute: 0.5 10*3/uL (ref 0.1–0.9)
Monocytes: 8 %
Neutrophils Absolute: 3.7 10*3/uL (ref 1.4–7.0)
Neutrophils: 57 %
Phosphorus: 3.1 mg/dL (ref 2.8–4.1)
Platelets: 201 10*3/uL (ref 150–450)
Potassium: 4.7 mmol/L (ref 3.5–5.2)
RBC: 5.11 x10E6/uL (ref 4.14–5.80)
RDW: 12 % (ref 11.6–15.4)
Sodium: 144 mmol/L (ref 134–144)
T3 Uptake Ratio: 26 % (ref 24–39)
T4, Total: 5.4 ug/dL (ref 4.5–12.0)
TSH: 3.21 u[IU]/mL (ref 0.450–4.500)
Total Protein: 6.2 g/dL (ref 6.0–8.5)
Triglycerides: 89 mg/dL (ref 0–149)
Uric Acid: 5 mg/dL (ref 3.8–8.4)
VLDL Cholesterol Cal: 15 mg/dL (ref 5–40)
WBC: 6.6 10*3/uL (ref 3.4–10.8)
eGFR: 111 mL/min/{1.73_m2} (ref >59)

## 2021-07-18 ENCOUNTER — Encounter: Payer: Self-pay | Admitting: Physician Assistant

## 2021-07-18 ENCOUNTER — Ambulatory Visit: Payer: Self-pay | Admitting: Physician Assistant

## 2021-07-18 VITALS — BP 124/82 | HR 76 | Temp 98.2°F | Resp 12 | Ht 68.0 in | Wt 159.0 lb

## 2021-07-18 DIAGNOSIS — Z0289 Encounter for other administrative examinations: Secondary | ICD-10-CM

## 2021-07-18 DIAGNOSIS — E7800 Pure hypercholesterolemia, unspecified: Secondary | ICD-10-CM

## 2021-07-18 MED ORDER — SIMVASTATIN 20 MG PO TABS: 20.0000 mg | ORAL_TABLET | Freq: Every day | ORAL | 3 refills | Status: DC

## 2021-07-18 NOTE — Progress Notes (Signed)
Has lost 32 lbs since 11/2020 ? ?Stopped taking BP med 11/2020 ? ?States he's had prednisone a couple times & not type of reaction ?Thinks what happened before (heart racing/palpitations) was a normal side effect of med.  ? ?AMD ?

## 2021-07-18 NOTE — Progress Notes (Signed)
[facility]   ____________________________________________   None    (approximate)  I have reviewed the triage vital signs and the nursing notes.   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  Patient voiced no concerns or complaints.  Patient admits to noncompliance of hyperlipidemia medication.  Patient stated he is on a strict diet.  Patient is aware of his elevated cholesterol.         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 Authorizing Provider  albuterol (VENTOLIN HFA) 108 (90 Base) MCG/ACT inhaler Inhale 2 puffs into the lungs every 6 (six) hours as needed for wheezing or shortness of breath. 12/27/18   Erin Fulling, MD  cetirizine (ZYRTEC) 10 MG tablet Take 1 tablet (10 mg total) by mouth daily. Patient not taking: Reported on 07/18/2021 10/11/15   Erin Fulling, MD  fluticasone [facility]) 50 MCG/ACT nasal spray Place 2 sprays into both nostrils daily. Patient not taking: Reported on 07/18/2021 01/14/20   Viviano Simas, FNP  hydrochlorothiazide (HYDRODIURIL) 25 MG tablet Take 1 tablet (25 mg total) by mouth daily. Patient not taking: Reported on 02/14/2021 07/19/20   Joni Reining, PA-C  meclizine (ANTIVERT) 25 MG tablet Take 1 tablet (25 mg total) by mouth 3 (three) times daily as needed for dizziness. Patient taking differently: Take 25 mg by mouth 3  (three) times daily as needed for dizziness (as needed). 01/15/20   Joni Reining, PA-C  Multiple Vitamin (MULTIVITAMIN) tablet Take 1 tablet by mouth daily. Patient not taking: Reported on 07/18/2021    [provider]  omega-3 acid ethyl esters (LOVAZA) 1 g capsule Take 1 g by mouth daily.    [provider]  rosuvastatin (CRESTOR) 40 MG tablet Take 1 tablet (40 mg total) by mouth daily. Patient not taking: Reported on 02/14/2021 07/13/20   Joni Reining, PA-C  vitamin C (ASCORBIC ACID) 500 MG tablet Take 500 mg by mouth daily. Patient not taking: Reported on 07/18/2021    [provider]    Allergies Prednisone  Family History  Problem Relation Age of Onset   Hypertension Mother    Hyperlipidemia Mother    Hypertension Father    Hyperlipidemia Father    Stroke Maternal Grandfather    Heart disease Paternal Grandfather     Social History Social History   Tobacco Use   Smoking status: Former    Types: Cigarettes    Quit date: 08/03/2015    Years since quitting: 5.9   Smokeless tobacco: Former    Types: Snuff   Tobacco comments:    occ  Substance Use Topics   Alcohol use: Yes    Comment: occ   Drug use: No    Review of Systems Constitutional: No fever/chills Eyes: No visual changes. ENT:  No sore throat. Cardiovascular: Denies chest pain. Respiratory: Denies shortness of breath. Gastrointestinal: No abdominal pain.  No nausea, no vomiting.  No diarrhea.  No constipation. Genitourinary: Negative for dysuria. Musculoskeletal: Negative for back pain. Skin: Negative for rash. Neurological: Negative for headaches, focal weakness or numbness. Endocrine: Hyperlipidemia Allergic/Immunilogical: Prednisone ____________________________________________   PHYSICAL EXAM:  VITAL SIGNS: BP is 124/82, pulse 76, respiration 12, temperature 98.2, patient 98% O2 sat on room air.  Patient weighs 159 pounds and BMI is 24.18. Constitutional: Alert and  oriented. Well appearing and in no acute distress. Eyes: Conjunctivae are normal. PERRL. EOMI. Head: Atraumatic. Nose: No congestion/rhinnorhea. Mouth/Throat: Mucous membranes are moist.  Oropharynx non-erythematous. Neck: No stridor.  No cervical spine tenderness to palpation. Hematological/Lymphatic/Immunilogical: No cervical lymphadenopathy. Cardiovascular: Normal rate, regular rhythm. Grossly normal heart sounds.  Good peripheral circulation. Respiratory: Normal respiratory effort.  No retractions. Lungs CTAB. Gastrointestinal: Soft and nontender. No distention. No abdominal bruits. No CVA tenderness. Genitourinary: Deferred Musculoskeletal: No lower extremity tenderness nor edema.  No joint effusions. Neurologic:  Normal speech and language. No gross focal neurologic deficits are appreciated. No gait instability. Skin:  Skin is warm, dry and intact. No rash noted. Psychiatric: Mood and affect are normal. Speech and behavior are normal.  ____________________________________________   LABS        Component Ref Range & Units 7 d ago (07/11/21) 1 yr ago (06/28/20) 2 yr ago (06/19/19) 2 yr ago (12/27/18)  Color, UA  Yellow  yellow  Dark Yellow  Yellow   Clarity, UA  Clear  clear  Clear  Clear   Glucose, UA Negative Negative  Negative  Negative  Negative   Bilirubin, UA  Negative  negative  Negative  Negative   Ketones, UA  Negative  negative  Positive CM  Negative   Spec Grav, UA 1.010 - 1.025 1.020  1.015  1.025  >=1.030 Abnormal    Blood, UA  Negative  negative  Negative  Negative   pH, UA 5.0 - 8.0 6.5  6.0  6.0  5.5   Protein, UA Negative Negative  Negative  Positive Abnormal  CM  Negative   Urobilinogen, UA 0.2 or 1.0 E.U./dL 0.2  0.2  0.2  0.2   Nitrite, UA  Negative  negative  Negative  Negative   Leukocytes, UA Negative Negative  Negative  Negative  Negative   Appearance   light       Odor                 Specimen Collected: 07/11/21 09:59 Last Resulted: 07/11/21 09:59       Lab Flowsheet    Order Details    View Encounter    Lab and Collection Details    Routing    Result History    View Encounter Conversation      CM=Additional comments      Result Care Coordination   Patient Communication   Add Comments   Add Notifications  Back to Top       Other Results from 07/11/2021   Contains abnormal data CMP12+LP+TP+TSH+6AC+CBC/D/Plt Order: [id number] Status: Final result    Visible to patient: Yes (not seen)    Next appt: 01/13/2022 at 08:15 AM in [facility] ([facility])    Dx: Encounter for physical examination re...    0 Result Notes          Component Ref Range & Units 7 d ago (07/11/21) 7 mo ago (12/15/20) 1 yr ago (06/28/20) 2  yr ago (06/19/19) 2 yr ago (12/27/18) 2 yr ago (12/26/18)  Glucose 70 - 99 mg/dL 87  83 CM  82 R  87 R   85 R   Uric Acid 3.8 - 8.4 mg/dL 5.0  7.6 CM  5.4 CM  6.2 CM   5.9 R, CM   Comment:            Therapeutic target for gout patients: <6.0  BUN 6 - 20 mg/dL 19  25 High   13  21 High    21 High    Creatinine, Ser 0.76 - 1.27 mg/dL 6.96  2.95  2.84  1.32   1.01   eGFR >59 mL/min/1.73 111  84  98      BUN/Creatinine Ratio 9 - 20 21 High   22 High   13  18   21  High    Sodium 134 - 144 mmol/L 144  140  139  140   138   Potassium 3.5 - 5.2 mmol/L 4.7  4.2  4.6  4.3   4.4   Chloride 96 - 106 mmol/L 105  102  101  104   102   Calcium 8.7 - 10.2 mg/dL 9.3  9.8  9.8  9.5   9.2   Phosphorus 2.8 - 4.1 mg/dL 3.1  2.5 Low   3.2  3.2   3.8   Total Protein 6.0 - 8.5 g/dL 6.2  6.9  6.7  6.4   6.5   Albumin 4.0 - 5.0 g/dL 4.4  4.9  4.5  4.3   4.4   Globulin, Total 1.5 - 4.5 g/dL 1.8  2.0  2.2  2.1   2.1   Albumin/Globulin Ratio 1.2 - 2.2 2.4 High   2.5 High   2.0  2.0   2.1   Bilirubin Total 0.0 - 1.2 mg/dL 1.0  0.8  0.6  1.2   1.4 High    Alkaline Phosphatase 44 - 121 IU/L 50  47  60  51 R   56 R   LDH 121 - 224 IU/L 141  176  184  184   187   AST 0 - 40 IU/L 19  30  25  24   22    ALT 0 - 44 IU/L 19  30  37  26   20   GGT  0 - 65 IU/L 11  15  23  13   13    Iron 38 - 169 ug/dL 440  52  81  102   725   Cholesterol, Total 100 - 199 mg/dL 366 High   440 High   347 High   246 High    255 High    Triglycerides 0 - 149 mg/dL 89  85  425 High   74   162 High    HDL >39 mg/dL 51  48  34 Low   42   37 Low    VLDL Cholesterol Cal 5 - 40 mg/dL 15  15  44 High   13   30   LDL Chol Calc (NIH) 0 - 99 mg/dL 956 High   387 High   564 High   191 High    188 High    Lipid Comment:  Comment        Comment: Possible Familial Hypercholesterolemia. FH should be suspected when  fasting LDL cholesterol is above 189 mg/dL or non-HDL cholesterol  is above 332 mg/dL. A family history of high cholesterol  and heart  disease in 1st degree relatives should be collected. J Clin Lipidol  2011;5:133-140   Chol/HDL Ratio 0.0 - 5.0 ratio 5.1 High   4.8 CM  7.6 High  CM  5.9 High  CM   6.9 High  CM   Comment:                                   T. Chol/HDL Ratio                                              Men  Women                                1/2 Avg.Risk  3.4    3.3                                    Avg.Risk  5.0    4.4                                 2X Avg.Risk  9.6    7.1                                 3X Avg.Risk 23.4   11.0   Estimated CHD Risk 0.0 - 1.0 times avg. 1.1 High   1.0 CM  1.6 High  CM  1.2 High  CM   1.5 High  CM   Comment: The CHD Risk is based on the T. Chol/HDL ratio. Other  factors affect CHD Risk such as hypertension, smoking,  diabetes, severe obesity, and family history of  premature CHD.   TSH 0.450 - 4.500 uIU/mL 3.210  3.940  3.470  3.190   3.950   T4, Total 4.5 - 12.0 ug/dL 5.4  7.1  6.3  5.4   6.4   T3 Uptake Ratio 24 - 39 % 26  30  24  25   27    Free Thyroxine Index 1.2 - 4.9 1.4  2.1  1.5  1.4   1.7   WBC 3.4 - 10.8 x10E3/uL 6.6  7.5  9.0  7.3  9.7 R  9.6   RBC 4.14 - 5.80 x10E6/uL 5.11  5.48  5.51  5.03  4.87 R  5.11   Hemoglobin 13.0 - 17.7 g/dL 40.9  81.1  91.4  78.2  15.4 R  15.7   Hematocrit 37.5 -  51.0 % 46.7  49.5  50.6  46.5  44.2 R  47.7   MCV 79 - 97 fL 91  90  92  92  90.8 R  93   MCH 26.6 - 33.0 pg 31.1  31.2  31.8  32.0  31.6 R  30.7   MCHC 31.5 - 35.7 g/dL 95.6  21.3  08.6  57.8  34.8 R  32.9   RDW 11.6 - 15.4 % 12.0  11.5 Low   11.9  11.9  11.4 Low  R  12.0   Platelets 150 - 450 x10E3/uL 201  209  226  187  206 R  209   Neutrophils Not Estab. % 57  60  57  59  64 R  66   Lymphs Not Estab. % 29  28  29  28   24    Monocytes Not Estab. % 8  8  8  8   7    Eos Not Estab. % 5  3  4  4   2    Basos Not Estab. % 1  1  1  1   1    Neutrophils Absolute 1.4 - 7.0 x10E3/uL 3.7  4.5  5.2  4.3  6.2 R  6.4   Lymphocytes Absolute 0.7 - 3.1 x10E3/uL 1.9  2.1  2.6  2.0  2.4 R  2.3   Monocytes Absolute 0.1 - 0.9 x10E3/uL 0.5  0.6  0.8  0.6   0.7   EOS (ABSOLUTE) 0.0 - 0.4 x10E3/uL 0.3  0.2  0.4  0.3   0.2   Basophils Absolute 0.0 - 0.2 x10E3/uL 0.1  0.1  0.1  0.1  0.1 R  0.1   Immature Granulocytes Not Estab. % 0  0  1  0  0 R  0   Immature Grans            ____________________________________________  EKG  Normal sinus rhythm 66 bpm. ____________________________________________    ____________________________________________   INITIAL IMPRESSION / ASSESSMENT AND PLAN  As part of my medical decision making, I reviewed the following data within the electronic MEDICAL RECORD NUMBER      Discussed labs results with patient.  Patient amenable to starting a lipid lowering medication for follow-up in 6 months.        ____________________________________________   FINAL CLINICAL IMPRESSION Well exam   ED Discharge Orders     None        Note:  This document was prepared using Dragon voice recognition software and may include unintentional dictation errors.

## 2021-08-10 DIAGNOSIS — H5210 Myopia, unspecified eye: Secondary | ICD-10-CM | POA: Diagnosis not present

## 2021-08-29 ENCOUNTER — Other Ambulatory Visit: Payer: Self-pay | Admitting: Physician Assistant

## 2021-08-29 MED ORDER — ALPRAZOLAM 1 MG PO TABS: 1.0000 mg | ORAL_TABLET | Freq: Every day | ORAL | 0 refills | Status: DC

## 2021-12-20 DIAGNOSIS — D2271 Melanocytic nevi of right lower limb, including hip: Secondary | ICD-10-CM | POA: Diagnosis not present

## 2021-12-20 DIAGNOSIS — D225 Melanocytic nevi of trunk: Secondary | ICD-10-CM | POA: Diagnosis not present

## 2021-12-20 DIAGNOSIS — D2261 Melanocytic nevi of right upper limb, including shoulder: Secondary | ICD-10-CM | POA: Diagnosis not present

## 2021-12-20 DIAGNOSIS — L570 Actinic keratosis: Secondary | ICD-10-CM | POA: Diagnosis not present

## 2021-12-20 DIAGNOSIS — D2262 Melanocytic nevi of left upper limb, including shoulder: Secondary | ICD-10-CM | POA: Diagnosis not present

## 2021-12-20 DIAGNOSIS — D2272 Melanocytic nevi of left lower limb, including hip: Secondary | ICD-10-CM | POA: Diagnosis not present

## 2022-01-13 ENCOUNTER — Other Ambulatory Visit: Payer: 59

## 2022-01-17 ENCOUNTER — Other Ambulatory Visit: Payer: Self-pay

## 2022-01-17 DIAGNOSIS — E7800 Pure hypercholesterolemia, unspecified: Secondary | ICD-10-CM

## 2022-01-17 NOTE — Progress Notes (Signed)
Pt presents today to complete lipid panel. 

## 2022-01-18 LAB — LIPID PANEL
Chol/HDL Ratio: 5.4 ratio — ABNORMAL HIGH (ref 0.0–5.0)
Cholesterol, Total: 316 mg/dL — ABNORMAL HIGH (ref 100–199)
HDL: 59 mg/dL (ref >39)
LDL Chol Calc (NIH): 242 mg/dL — ABNORMAL HIGH (ref 0–99)
Triglycerides: 93 mg/dL (ref 0–149)
VLDL Cholesterol Cal: 15 mg/dL (ref 5–40)

## 2022-02-14 ENCOUNTER — Other Ambulatory Visit: Payer: Self-pay

## 2022-02-14 DIAGNOSIS — F418 Other specified anxiety disorders: Secondary | ICD-10-CM

## 2022-02-14 MED ORDER — ALPRAZOLAM 1 MG PO TABS: 1.0000 mg | ORAL_TABLET | Freq: Every day | ORAL | 0 refills | Status: DC

## 2022-04-10 DIAGNOSIS — L814 Other melanin hyperpigmentation: Secondary | ICD-10-CM | POA: Diagnosis not present

## 2022-04-25 ENCOUNTER — Encounter: Payer: Self-pay | Admitting: Physician Assistant

## 2022-04-25 ENCOUNTER — Ambulatory Visit: Payer: Self-pay | Admitting: Physician Assistant

## 2022-04-25 VITALS — BP 130/86 | HR 88 | Temp 98.1°F | Resp 14 | Wt 173.0 lb

## 2022-04-25 DIAGNOSIS — J0120 Acute ethmoidal sinusitis, unspecified: Secondary | ICD-10-CM

## 2022-04-25 DIAGNOSIS — R051 Acute cough: Secondary | ICD-10-CM

## 2022-04-25 LAB — POCT INFLUENZA A/B
Influenza A, POC: NEGATIVE
Influenza B, POC: NEGATIVE

## 2022-04-25 LAB — POC COVID19 BINAXNOW: SARS Coronavirus 2 Ag: NEGATIVE

## 2022-04-25 MED ORDER — FEXOFENADINE-PSEUDOEPHED ER 60-120 MG PO TB12: 1.0000 | ORAL_TABLET | Freq: Two times a day (BID) | ORAL | 0 refills | Status: DC

## 2022-04-25 MED ORDER — AMOXICILLIN 875 MG PO TABS: 875.0000 mg | ORAL_TABLET | Freq: Two times a day (BID) | ORAL | 0 refills | Status: AC

## 2022-04-25 MED ORDER — BENZONATATE 200 MG PO CAPS: 200.0000 mg | ORAL_CAPSULE | Freq: Two times a day (BID) | ORAL | 0 refills | Status: DC | PRN

## 2022-04-25 NOTE — Progress Notes (Signed)
   Subjective: Sinus congestion and cough    Patient ID: Alec Gonzales, male    DOB: Mar 17, 1980, 42 y.o.   MRN: [id number]  HPI  Patient complain of sinus congestion for intermittent postnasal drainage and cough for greater than 1 week.  Denies fever chills associated complaint.  Denies recent travel or known contact with COVID-19.  Tested negative today for COVID-19 and influenza.  Review of Systems Reactive airway    Objective:   Physical Exam  BP is 130/86, pulse 88, respiration 14, temperature 98.1, patient 97% O2 sat on room air.  Patient weighs 173 pounds and BMI is 26.30. HEENT is remarkable bilateral edematous nasal turbinates.  Postnasal drainage. Neck is supple for lymphadenopathy or bruits. Lungs are clear to auscultation. Heart regular rate and rhythm.      Assessment & Plan: Subacute maxillary sinusitis   Patient given prescription for Allegra-D, amoxicillin, and Tessalon Perles.  Patient advised to follow-up with no improvement in 1 week.

## 2022-04-25 NOTE — Progress Notes (Signed)
Over one week of sinus and congestion complaints, but not taking any meds at this time.  Tested for covid and flu and negative for those.

## 2022-08-10 ENCOUNTER — Ambulatory Visit: Payer: Self-pay

## 2022-08-10 DIAGNOSIS — Z0289 Encounter for other administrative examinations: Secondary | ICD-10-CM

## 2022-08-10 LAB — POCT URINALYSIS DIPSTICK
Bilirubin, UA: NEGATIVE
Blood, UA: NEGATIVE
Glucose, UA: NEGATIVE
Ketones, UA: NEGATIVE
Leukocytes, UA: NEGATIVE
Nitrite, UA: NEGATIVE
Protein, UA: NEGATIVE
Spec Grav, UA: 1.015 (ref 1.010–1.025)
Urobilinogen, UA: 0.2 E.U./dL
pH, UA: 6 (ref 5.0–8.0)

## 2022-08-10 NOTE — Progress Notes (Signed)
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

## 2022-08-11 LAB — CMP12+LP+TP+TSH+6AC+PSA+CBC…
ALT: 21 IU/L (ref 0–44)
AST: 26 IU/L (ref 0–40)
Albumin/Globulin Ratio: 2.2 (ref 1.2–2.2)
Albumin: 4.6 g/dL (ref 4.1–5.1)
Alkaline Phosphatase: 41 IU/L — ABNORMAL LOW (ref 44–121)
BUN/Creatinine Ratio: 24 — ABNORMAL HIGH (ref 9–20)
BUN: 26 mg/dL — ABNORMAL HIGH (ref 6–24)
Basophils Absolute: 0.1 10*3/uL (ref 0.0–0.2)
Basos: 1 %
Bilirubin Total: 0.7 mg/dL (ref 0.0–1.2)
Calcium: 9.2 mg/dL (ref 8.7–10.2)
Chloride: 102 mmol/L (ref 96–106)
Chol/HDL Ratio: 6.1 ratio — ABNORMAL HIGH (ref 0.0–5.0)
Cholesterol, Total: 288 mg/dL — ABNORMAL HIGH (ref 100–199)
Creatinine, Ser: 1.08 mg/dL (ref 0.76–1.27)
EOS (ABSOLUTE): 0.3 10*3/uL (ref 0.0–0.4)
Eos: 5 %
Estimated CHD Risk: 1.3 times avg. — ABNORMAL HIGH (ref 0.0–1.0)
Free Thyroxine Index: 1.4 (ref 1.2–4.9)
GGT: 10 IU/L (ref 0–65)
Globulin, Total: 2.1 g/dL (ref 1.5–4.5)
Glucose: 77 mg/dL (ref 70–99)
HDL: 47 mg/dL (ref >39)
Hematocrit: 47.6 % (ref 37.5–51.0)
Hemoglobin: 16 g/dL (ref 13.0–17.7)
Immature Grans (Abs): 0 10*3/uL (ref 0.0–0.1)
Immature Granulocytes: 0 %
Iron: 70 ug/dL (ref 38–169)
LDH: 210 IU/L (ref 121–224)
LDL Chol Calc (NIH): 221 mg/dL — ABNORMAL HIGH (ref 0–99)
Lymphocytes Absolute: 1.9 10*3/uL (ref 0.7–3.1)
Lymphs: 26 %
MCH: 31.7 pg (ref 26.6–33.0)
MCHC: 33.6 g/dL (ref 31.5–35.7)
MCV: 94 fL (ref 79–97)
Monocytes Absolute: 0.6 10*3/uL (ref 0.1–0.9)
Monocytes: 8 %
Neutrophils Absolute: 4.6 10*3/uL (ref 1.4–7.0)
Neutrophils: 60 %
Phosphorus: 2.6 mg/dL — ABNORMAL LOW (ref 2.8–4.1)
Platelets: 210 10*3/uL (ref 150–450)
Potassium: 5 mmol/L (ref 3.5–5.2)
Prostate Specific Ag, Serum: 0.5 ng/mL (ref 0.0–4.0)
RBC: 5.05 x10E6/uL (ref 4.14–5.80)
RDW: 11.9 % (ref 11.6–15.4)
Sodium: 136 mmol/L (ref 134–144)
T3 Uptake Ratio: 26 % (ref 24–39)
T4, Total: 5.5 ug/dL (ref 4.5–12.0)
TSH: 3.83 u[IU]/mL (ref 0.450–4.500)
Total Protein: 6.7 g/dL (ref 6.0–8.5)
Triglycerides: 114 mg/dL (ref 0–149)
Uric Acid: 6 mg/dL (ref 3.8–8.4)
VLDL Cholesterol Cal: 20 mg/dL (ref 5–40)
WBC: 7.6 10*3/uL (ref 3.4–10.8)
eGFR: 89 mL/min/{1.73_m2} (ref >59)

## 2022-08-22 ENCOUNTER — Encounter: Payer: Self-pay | Admitting: Adult Health

## 2022-09-07 ENCOUNTER — Encounter: Payer: Self-pay | Admitting: Adult Health

## 2022-09-07 ENCOUNTER — Ambulatory Visit: Payer: Self-pay | Admitting: Adult Health

## 2022-09-07 VITALS — BP 132/90 | HR 80 | Temp 98.9°F | Resp 12 | Ht 68.0 in | Wt 171.0 lb

## 2022-09-07 DIAGNOSIS — R21 Rash and other nonspecific skin eruption: Secondary | ICD-10-CM

## 2022-09-07 DIAGNOSIS — Z0000 Encounter for general adult medical examination without abnormal findings: Secondary | ICD-10-CM

## 2022-09-07 DIAGNOSIS — Z0289 Encounter for other administrative examinations: Secondary | ICD-10-CM

## 2022-09-07 DIAGNOSIS — J4520 Mild intermittent asthma, uncomplicated: Secondary | ICD-10-CM

## 2022-09-07 DIAGNOSIS — I10 Essential (primary) hypertension: Secondary | ICD-10-CM

## 2022-09-07 MED ORDER — NYSTATIN-TRIAMCINOLONE 100000-0.1 UNIT/GM-% EX OINT: 1.0000 | TOPICAL_OINTMENT | Freq: Two times a day (BID) | CUTANEOUS | 0 refills | Status: DC

## 2022-09-07 NOTE — Progress Notes (Signed)
Scaly rash about the size of a quarter on right inner thigh Been there about a week Doesn't itch  AMD

## 2022-09-07 NOTE — Progress Notes (Signed)
[facility] [address] [place], [place] [address]  Internal MEDICINE  Office Visit Note  Patient Name: Alec Gonzales  [id number]  [id number]  Date of Service: 09/07/2022  Chief Complaint  Patient presents with   Annual Exam     HPI Pt is here for routine Firefighter physical.  Alec Gonzales is a well-appearing 42 year old male.  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  He has been married to his wife for 15 years and has 1 biological son who is 89 years old and his adopted daughter who is 42.  He has a well-established history of high cholesterol.  In the past he has been medicated however per him, there was no real difference in his numbers.  He has elected not to take medication and generally controls his cholesterol with diet and exercise.  He has per his own admission gained a little weight and feels like this is why his cholesterol has continued to elevate. He also has a history of asthma, and some HTN.  He denies any issues or complaints at this time except for a small rash on his right inner thigh that he would like looked at.  Current Medication: Outpatient Encounter Medications as of 09/07/2022  Medication Sig Note   nystatin-triamcinolone ointment (MYCOLOG) Apply 1 Application topically 2 (two) times daily.    ALPRAZolam (XANAX) 1 MG tablet Take 1 tablet (1 mg total) by mouth daily at 12 noon. Take one hour before flight. (Patient taking differently: Take 1 mg by mouth as needed. Take one hour before flight.)    meclizine (ANTIVERT) 25 MG tablet Take 1 tablet (25 mg total) by mouth 3 (three) times daily as needed for dizziness. 07/18/2021: Uses prn   No facility-administered encounter medications on file as of 09/07/2022.    Surgical History: Past Surgical History:  Procedure Laterality Date   RIB FRACTURE SURGERY     TONSILLECTOMY     VASECTOMY      Medical History: Past Medical History:  Diagnosis Date    Elevated LDL cholesterol level    Headache    Hyperlipidemia    Low testosterone    Pericarditis    Reactive airway disease    allergic    Family History: Family History  Problem Relation Age of Onset   Hypertension Mother    Hyperlipidemia Mother    Hypertension Father    Hyperlipidemia Father    Stroke Maternal Grandfather    Heart disease Paternal Grandfather     Social History: Social History   Socioeconomic History   Marital status: Married    Spouse name: Not on file   Number of children: Not on file   Years of education: Not on file   Highest education level: Not on file  Occupational History   Not on file  Tobacco Use   Smoking status: Former    Types: Cigarettes    Quit date: 08/03/2015    Years since quitting: 7.1   Smokeless tobacco: Former    Types: Snuff   Tobacco comments:    Occ smoke a pipe  Substance and Sexual Activity   Alcohol use: Yes    Comment: occ   Drug use: No   Sexual activity: Not on file  Other Topics Concern   Not on file  Social History Narrative   Not on file   Social Determinants of Health   Financial Resource Strain: Not on file  Food Insecurity: Not on file  Transportation Needs: Not on file  Physical Activity: Not on file  Stress: Not on file  Social Connections: Not on file      Review of Systems  Constitutional:  Negative for activity change, appetite change and fatigue.  HENT:  Negative for congestion, sinus pain, trouble swallowing and voice change.   Eyes:  Negative for pain, discharge and visual disturbance.  Respiratory:  Negative for cough, chest tightness and shortness of breath.   Cardiovascular:  Negative for chest pain and leg swelling.  Gastrointestinal:  Negative for abdominal distention, abdominal pain, constipation and diarrhea.  Musculoskeletal:  Negative for arthralgias, back pain and neck pain.  Skin:  Positive for rash. Negative for color change.  Neurological:  Negative for dizziness,  weakness and headaches.  Hematological:  Negative for adenopathy.  Psychiatric/Behavioral:  Negative for agitation, confusion and suicidal ideas.      Vital Signs: BP (!) 132/90 (BP Location: Left Arm, Patient Position: Sitting, Cuff Size: Large)   Pulse 80   Temp 98.9 F (37.2 C) (Temporal)   Resp 12   Ht 5\' 8"  (1.727 m)   Wt 171 lb (77.6 kg)   SpO2 96%   BMI 26.00 kg/m    Physical Exam Constitutional:      Appearance: Normal appearance.  HENT:     Head: Normocephalic.     Right Ear: Tympanic membrane normal.     Left Ear: Tympanic membrane normal.     Nose: Nose normal.     Mouth/Throat:     Mouth: Mucous membranes are moist.     Pharynx: No oropharyngeal exudate or posterior oropharyngeal erythema.  Eyes:     General:        Right eye: No discharge.        Left eye: No discharge.     Extraocular Movements: Extraocular movements intact.     Pupils: Pupils are equal, round, and reactive to light.  Cardiovascular:     Rate and Rhythm: Normal rate and regular rhythm.     Pulses: Normal pulses.     Heart sounds: Normal heart sounds. No murmur heard. Pulmonary:     Effort: Pulmonary effort is normal. No respiratory distress.     Breath sounds: Normal breath sounds. No wheezing or rhonchi.  Abdominal:     General: Abdomen is flat. Bowel sounds are normal. There is no distension.     Palpations: There is no mass.     Tenderness: There is no abdominal tenderness. There is no guarding.     Hernia: No hernia is present.  Musculoskeletal:        General: No swelling or deformity. Normal range of motion.     Cervical back: Normal range of motion.  Skin:    General: Skin is warm and dry.     Capillary Refill: Capillary refill takes less than 2 seconds.          Comments: Scaley, patchy rash, mild central clearing. One satellite lesion beginning.   Neurological:     General: No focal deficit present.     Mental Status: He is alert.     Cranial Nerves: No cranial nerve  deficit.     Gait: Gait normal.  Psychiatric:        Mood and Affect: Mood normal.        Behavior: Behavior normal.        Judgment: Judgment normal.      LABS: Recent Results (from the  past 2160 hour(s))  CMP12+LP+TP+TSH+6AC+PSA+CBC.     Status: Abnormal   Collection Time: 08/10/22  8:22 AM  Result Value Ref Range   Glucose 77 70 - 99 mg/dL   Uric Acid 6.0 3.8 - 8.4 mg/dL    Comment:            Therapeutic target for gout patients: <6.0   BUN 26 (H) 6 - 24 mg/dL   Creatinine, Ser 4.09 0.76 - 1.27 mg/dL   eGFR 89 >81 XB/JYN/8.29   BUN/Creatinine Ratio 24 (H) 9 - 20   Sodium 136 134 - 144 mmol/L   Potassium 5.0 3.5 - 5.2 mmol/L   Chloride 102 96 - 106 mmol/L   Calcium 9.2 8.7 - 10.2 mg/dL   Phosphorus 2.6 (L) 2.8 - 4.1 mg/dL   Total Protein 6.7 6.0 - 8.5 g/dL   Albumin 4.6 4.1 - 5.1 g/dL   Globulin, Total 2.1 1.5 - 4.5 g/dL   Albumin/Globulin Ratio 2.2 1.2 - 2.2   Bilirubin Total 0.7 0.0 - 1.2 mg/dL   Alkaline Phosphatase 41 (L) 44 - 121 IU/L   LDH 210 121 - 224 IU/L   AST 26 0 - 40 IU/L   ALT 21 0 - 44 IU/L   GGT 10 0 - 65 IU/L   Iron 70 38 - 169 ug/dL   Cholesterol, Total 562 (H) 100 - 199 mg/dL   Triglycerides 130 0 - 149 mg/dL   HDL 47 >86 mg/dL   VLDL Cholesterol Cal 20 5 - 40 mg/dL   LDL Chol Calc (NIH) 578 (H) 0 - 99 mg/dL   Lipid Comment: Comment     Comment: Possible Familial Hypercholesterolemia. FH should be suspected when fasting LDL cholesterol is above 189 mg/dL or non-HDL cholesterol is above 219 mg/dL. A family history of high cholesterol and heart disease in 1st degree relatives should be collected. J Clin Lipidol 2011;5:133-140    Chol/HDL Ratio 6.1 (H) 0.0 - 5.0 ratio    Comment:                                   T. Chol/HDL Ratio                                             Men  Women                               1/2 Avg.Risk  3.4    3.3                                   Avg.Risk  5.0    4.4                                2X Avg.Risk  9.6     7.1                                3X Avg.Risk 23.4   11.0    Estimated CHD Risk 1.3 (H) 0.0 - 1.0 times avg.    Comment: The  CHD Risk is based on the T. Chol/HDL ratio. Other factors affect CHD Risk such as hypertension, smoking, diabetes, severe obesity, and family history of premature CHD.    TSH 3.830 0.450 - 4.500 uIU/mL   T4, Total 5.5 4.5 - 12.0 ug/dL   T3 Uptake Ratio 26 24 - 39 %   Free Thyroxine Index 1.4 1.2 - 4.9   Prostate Specific Ag, Serum 0.5 0.0 - 4.0 ng/mL    Comment: Roche ECLIA methodology. According to the American Urological Association, Serum PSA should decrease and remain at undetectable levels after radical prostatectomy. The AUA defines biochemical recurrence as an initial PSA value 0.2 ng/mL or greater followed by a subsequent confirmatory PSA value 0.2 ng/mL or greater. Values obtained with different assay methods or kits cannot be used interchangeably. Results cannot be interpreted as absolute evidence of the presence or absence of malignant disease.    WBC 7.6 3.4 - 10.8 x10E3/uL   RBC 5.05 4.14 - 5.80 x10E6/uL   Hemoglobin 16.0 13.0 - 17.7 g/dL   Hematocrit 96.0 45.4 - 51.0 %   MCV 94 79 - 97 fL   MCH 31.7 26.6 - 33.0 pg   MCHC 33.6 31.5 - 35.7 g/dL   RDW 09.8 11.9 - 14.7 %   Platelets 210 150 - 450 x10E3/uL   Neutrophils 60 Not Estab. %   Lymphs 26 Not Estab. %   Monocytes 8 Not Estab. %   Eos 5 Not Estab. %   Basos 1 Not Estab. %   Neutrophils Absolute 4.6 1.4 - 7.0 x10E3/uL   Lymphocytes Absolute 1.9 0.7 - 3.1 x10E3/uL   Monocytes Absolute 0.6 0.1 - 0.9 x10E3/uL   EOS (ABSOLUTE) 0.3 0.0 - 0.4 x10E3/uL   Basophils Absolute 0.1 0.0 - 0.2 x10E3/uL   Immature Granulocytes 0 Not Estab. %   Immature Grans (Abs) 0.0 0.0 - 0.1 x10E3/uL  POCT urinalysis dipstick     Status: Normal   Collection Time: 08/10/22  8:27 AM  Result Value Ref Range   Color, UA yellow    Clarity, UA cleare    Glucose, UA Negative Negative   Bilirubin, UA neg     Ketones, UA neg    Spec Grav, UA 1.015 1.010 - 1.025   Blood, UA neg    pH, UA 6.0 5.0 - 8.0   Protein, UA Negative Negative   Urobilinogen, UA 0.2 0.2 or 1.0 E.U./dL   Nitrite, UA neg    Leukocytes, UA Negative Negative   Appearance dark    Odor none      Assessment/Plan: 1. Annual physical exam Up to date on PHM.   2. Rash and nonspecific skin eruption Use Nystatin cream as prescribed. Follow up via MyChart messenger if symptoms fail to improve or may return to clinic as needed for worsening symptoms.    3. Mild intermittent reactive airway disease without complication Continue to use inhaler as needed.   4. Essential hypertension Hypertension Counseling:   The following hypertensive lifestyle modification were recommended and discussed:  1. Limiting alcohol intake to less than 1 oz/day of ethanol:(24 oz of beer or 8 oz of wine or 2 oz of 100-proof whiskey). 2. Importance of regular aerobic exercise and losing weight. 3. Reduce dietary saturated fat and cholesterol intake for overall cardiovascular health. 5. Maintaining adequate dietary potassium, calcium, and magnesium intake. 4. Regular monitoring of the blood pressure. 5. Reduce sodium intake to less than 100 mmol/day (less than 2.3 gm of sodium or less than  6 gm of sodium choride)       General Counseling: mert dietrick understanding of the findings of todays visit and agrees with plan of treatment. I have discussed any further diagnostic evaluation that may be needed or ordered today. We also reviewed his medications today. he has been encouraged to call the office with any questions or concerns that should arise related to todays visit.    No orders of the defined types were placed in this encounter.   Meds ordered this encounter  Medications   nystatin-triamcinolone ointment (MYCOLOG)    Sig: Apply 1 Application topically 2 (two) times daily.    Dispense:  30 g    Refill:  0    Total time spent:25  Minutes  Time spent includes review of chart, medications, test results, and follow up plan with the patient.    Johnna Acosta AGNP-C Nurse Practitioner

## 2022-09-21 ENCOUNTER — Ambulatory Visit: Payer: 59 | Admitting: Physician Assistant

## 2022-09-21 ENCOUNTER — Encounter: Payer: Self-pay | Admitting: Physician Assistant

## 2022-09-21 DIAGNOSIS — S80861A Insect bite (nonvenomous), right lower leg, initial encounter: Secondary | ICD-10-CM

## 2022-09-21 MED ORDER — HYDROXYZINE PAMOATE 25 MG PO CAPS: 25.0000 mg | ORAL_CAPSULE | Freq: Three times a day (TID) | ORAL | 0 refills | Status: DC | PRN

## 2022-09-21 NOTE — Progress Notes (Signed)
Request televisit to discuss possible reaction to bite by insects in private areas.

## 2022-09-21 NOTE — Progress Notes (Signed)
   Subjective: Insect bite, abdominal pain, and right testicle pain    Patient ID: Alec Gonzales, male    DOB: 1980/09/25, 42 y.o.   MRN: [id number]  HPI This is a telephonic encounter for patient complaining of welts from repeated insect bites to the left buttocks.  Patient stated there is mild redness and stinging.  Patient also states he is having pain from his lower abdomen that radiates to his testicle.  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  Denies urinary complaints.   Review of Systems Negative except for above complaint    Objective:   Physical Exam Deferred secondary to telephonic encounter       Assessment & Plan: Insect bite and possible inguinal strain.  Patient is amenable to prescription for antihistamines for insect bites.  Advised supportive care and NSAIDs for pain radiating to the testicle.  Patient advised to follow-up with urgent care or emergency room if no improvement or worsening complaint in the next 3 days.

## 2022-11-06 ENCOUNTER — Other Ambulatory Visit: Payer: Self-pay

## 2022-11-06 DIAGNOSIS — F418 Other specified anxiety disorders: Secondary | ICD-10-CM

## 2022-11-06 MED ORDER — ALPRAZOLAM 1 MG PO TABS
1.0000 mg | ORAL_TABLET | ORAL | 0 refills | Status: DC | PRN
Start: 2022-11-06 — End: 2024-01-11

## 2022-12-25 DIAGNOSIS — D2262 Melanocytic nevi of left upper limb, including shoulder: Secondary | ICD-10-CM | POA: Diagnosis not present

## 2022-12-25 DIAGNOSIS — D225 Melanocytic nevi of trunk: Secondary | ICD-10-CM | POA: Diagnosis not present

## 2022-12-25 DIAGNOSIS — L821 Other seborrheic keratosis: Secondary | ICD-10-CM | POA: Diagnosis not present

## 2022-12-25 DIAGNOSIS — D2272 Melanocytic nevi of left lower limb, including hip: Secondary | ICD-10-CM | POA: Diagnosis not present

## 2022-12-25 DIAGNOSIS — L814 Other melanin hyperpigmentation: Secondary | ICD-10-CM | POA: Diagnosis not present

## 2022-12-25 DIAGNOSIS — D2261 Melanocytic nevi of right upper limb, including shoulder: Secondary | ICD-10-CM | POA: Diagnosis not present

## 2022-12-25 DIAGNOSIS — D2271 Melanocytic nevi of right lower limb, including hip: Secondary | ICD-10-CM | POA: Diagnosis not present

## 2023-02-26 ENCOUNTER — Other Ambulatory Visit: Payer: Self-pay

## 2023-02-26 ENCOUNTER — Other Ambulatory Visit: Payer: Self-pay | Admitting: Physician Assistant

## 2023-02-26 ENCOUNTER — Emergency Department
Admission: EM | Admit: 2023-02-26 | Discharge: 2023-02-26 | Discharge status: Against Medical Advice | Payer: 59 | Attending: Emergency Medicine | Admitting: Emergency Medicine

## 2023-02-26 ENCOUNTER — Telehealth: Payer: Self-pay

## 2023-02-26 ENCOUNTER — Emergency Department: Payer: 59

## 2023-02-26 DIAGNOSIS — Z5321 Procedure and treatment not carried out due to patient leaving prior to being seen by health care provider: Secondary | ICD-10-CM | POA: Insufficient documentation

## 2023-02-26 DIAGNOSIS — K5900 Constipation, unspecified: Secondary | ICD-10-CM | POA: Diagnosis not present

## 2023-02-26 DIAGNOSIS — K4090 Unilateral inguinal hernia, without obstruction or gangrene, not specified as recurrent: Secondary | ICD-10-CM | POA: Diagnosis not present

## 2023-02-26 DIAGNOSIS — N200 Calculus of kidney: Secondary | ICD-10-CM

## 2023-02-26 DIAGNOSIS — R109 Unspecified abdominal pain: Secondary | ICD-10-CM | POA: Insufficient documentation

## 2023-02-26 DIAGNOSIS — N132 Hydronephrosis with renal and ureteral calculous obstruction: Secondary | ICD-10-CM | POA: Diagnosis not present

## 2023-02-26 DIAGNOSIS — R350 Frequency of micturition: Secondary | ICD-10-CM | POA: Insufficient documentation

## 2023-02-26 DIAGNOSIS — K5730 Diverticulosis of large intestine without perforation or abscess without bleeding: Secondary | ICD-10-CM | POA: Diagnosis not present

## 2023-02-26 LAB — BASIC METABOLIC PANEL
Anion gap: 9 (ref 5–15)
BUN: 22 mg/dL — ABNORMAL HIGH (ref 6–20)
CO2: 24 mmol/L (ref 22–32)
Calcium: 9.6 mg/dL (ref 8.9–10.3)
Chloride: 105 mmol/L (ref 98–111)
Creatinine, Ser: 1.23 mg/dL (ref 0.61–1.24)
GFR, Estimated: >60 mL/min (ref >60)
Glucose, Bld: 114 mg/dL — ABNORMAL HIGH (ref 70–99)
Potassium: 5.4 mmol/L — ABNORMAL HIGH (ref 3.5–5.1)
Sodium: 138 mmol/L (ref 135–145)

## 2023-02-26 LAB — CBC WITH DIFFERENTIAL/PLATELET
Abs Immature Granulocytes: 0.06 10*3/uL (ref 0.00–0.07)
Basophils Absolute: 0.1 10*3/uL (ref 0.0–0.1)
Basophils Relative: 1 %
Eosinophils Absolute: 0.2 10*3/uL (ref 0.0–0.5)
Eosinophils Relative: 1 %
HCT: 46.5 % (ref 39.0–52.0)
Hemoglobin: 15.9 g/dL (ref 13.0–17.0)
Immature Granulocytes: 0 %
Lymphocytes Relative: 15 %
Lymphs Abs: 2 10*3/uL (ref 0.7–4.0)
MCH: 31.9 pg (ref 26.0–34.0)
MCHC: 34.2 g/dL (ref 30.0–36.0)
MCV: 93.2 fL (ref 80.0–100.0)
Monocytes Absolute: 0.8 10*3/uL (ref 0.1–1.0)
Monocytes Relative: 6 %
Neutro Abs: 10.7 10*3/uL — ABNORMAL HIGH (ref 1.7–7.7)
Neutrophils Relative %: 77 %
Platelets: 208 10*3/uL (ref 150–400)
RBC: 4.99 MIL/uL (ref 4.22–5.81)
RDW: 11.5 % (ref 11.5–15.5)
WBC: 13.8 10*3/uL — ABNORMAL HIGH (ref 4.0–10.5)
nRBC: 0 % (ref 0.0–0.2)

## 2023-02-26 LAB — URINALYSIS, ROUTINE W REFLEX MICROSCOPIC
Bilirubin Urine: NEGATIVE
Glucose, UA: NEGATIVE mg/dL
Ketones, ur: NEGATIVE mg/dL
Leukocytes,Ua: NEGATIVE
Nitrite: NEGATIVE
Protein, ur: NEGATIVE mg/dL
Specific Gravity, Urine: 1.019 (ref 1.005–1.030)
Squamous Epithelial / HPF: 0 /[HPF] (ref 0–5)
pH: 5 (ref 5.0–8.0)

## 2023-02-26 MED ORDER — OXYCODONE-ACETAMINOPHEN 7.5-325 MG PO TABS: 1.0000 | ORAL_TABLET | Freq: Four times a day (QID) | ORAL | 0 refills | Status: DC | PRN

## 2023-02-26 MED ORDER — ONDANSETRON 4 MG PO TBDP
4.0000 mg | ORAL_TABLET | Freq: Once | ORAL | Status: AC
  Administered 2023-02-26: 4 mg via ORAL
  Filled 2023-02-26: qty 1

## 2023-02-26 MED ORDER — OXYCODONE-ACETAMINOPHEN 5-325 MG PO TABS
1.0000 | ORAL_TABLET | Freq: Once | ORAL | Status: AC
  Administered 2023-02-26: 1 via ORAL
  Filled 2023-02-26: qty 1

## 2023-02-26 NOTE — ED Triage Notes (Signed)
Pt reports right side flank pain and urinary frequency that began earlier tonight. Pt denies hx kidney stones.

## 2023-02-27 NOTE — Telephone Encounter (Signed)
Called to inquire from provider about pain management with reported kidney stones prior to procedure this Thursday for it, stating excruciating pain at times.

## 2023-02-28 ENCOUNTER — Ambulatory Visit (INDEPENDENT_AMBULATORY_CARE_PROVIDER_SITE_OTHER): Payer: 59 | Admitting: Urology

## 2023-02-28 ENCOUNTER — Ambulatory Visit
Admission: RE | Admit: 2023-02-28 | Discharge: 2023-02-28 | Disposition: A | Discharge status: Home / Self Care | Payer: 59 | Attending: Urology | Admitting: Urology

## 2023-02-28 ENCOUNTER — Ambulatory Visit
Admission: RE | Admit: 2023-02-28 | Discharge: 2023-02-28 | Disposition: A | Discharge status: Home / Self Care | Payer: 59 | Source: Ambulatory Visit | Attending: Urology | Admitting: Urology

## 2023-02-28 VITALS — BP 171/101 | HR 76 | Ht 68.0 in | Wt 176.4 lb

## 2023-02-28 DIAGNOSIS — I878 Other specified disorders of veins: Secondary | ICD-10-CM | POA: Diagnosis not present

## 2023-02-28 DIAGNOSIS — R109 Unspecified abdominal pain: Secondary | ICD-10-CM | POA: Diagnosis not present

## 2023-02-28 DIAGNOSIS — N201 Calculus of ureter: Secondary | ICD-10-CM

## 2023-02-28 DIAGNOSIS — N200 Calculus of kidney: Secondary | ICD-10-CM

## 2023-02-28 DIAGNOSIS — Z01818 Encounter for other preprocedural examination: Secondary | ICD-10-CM

## 2023-02-28 LAB — URINALYSIS, COMPLETE
Bilirubin, UA: NEGATIVE
Glucose, UA: NEGATIVE
Ketones, UA: NEGATIVE
Leukocytes,UA: NEGATIVE
Nitrite, UA: NEGATIVE
Protein,UA: NEGATIVE
Specific Gravity, UA: <1.005 — ABNORMAL LOW (ref 1.005–1.030)
Urobilinogen, Ur: 0.2 mg/dL (ref 0.2–1.0)
pH, UA: 6 (ref 5.0–7.5)

## 2023-02-28 LAB — MICROSCOPIC EXAMINATION: Bacteria, UA: NONE SEEN

## 2023-02-28 MED ORDER — TAMSULOSIN HCL 0.4 MG PO CAPS: 0.4000 mg | ORAL_CAPSULE | Freq: Every day | ORAL | 0 refills | Status: DC

## 2023-02-28 MED ORDER — ONDANSETRON HCL 4 MG PO TABS: 4.0000 mg | ORAL_TABLET | Freq: Three times a day (TID) | ORAL | 0 refills | Status: DC | PRN

## 2023-02-28 MED ORDER — OXYCODONE-ACETAMINOPHEN 7.5-325 MG PO TABS
1.0000 | ORAL_TABLET | Freq: Four times a day (QID) | ORAL | 0 refills | Status: DC | PRN
Start: 2023-02-28 — End: 2023-03-09

## 2023-02-28 NOTE — H&P (View-Only) (Signed)
[name],acting as a scribe for Vanna Scotland, MD.,have documented all relevant documentation on the behalf of Vanna Scotland, MD,as directed by  Vanna Scotland, MD while in the presence of Vanna Scotland, MD.  02/28/23 4:35 PM   [name] 1980-10-14 [id number]  Referring provider: No referring provider defined for this encounter.  Chief Complaint  Patient presents with   Establish Care   Nephrolithiasis    HPI: 42 year-old male who presents today for further evaluation of a kidney stone.   He was seen and evaluated on Monday in the emergency room with severe right flank pain and found to have a 4 mm right UPJ stone with hydronephrosis. He was treated and discharged in the absence of concern for infection. He did have a mild leukocytosis in the emergency room. His urine showed RBC, but no other evidence of infection. His creatinine was slightly elevated at 1.23, but not much different than his baseline.   He follows up today in the urology clinic. He got a KUB earlier today, which shows mild distal migration of this stone, now at the superior aspect of L4.   He reports not having taken any NSAIDs since the ER visit. He is considering treatment options, including shockwave lithotripsy, due to discomfort and the need to return to work. He is currently taking Percocet 7.5/325 acetaminophen for pain management.   PMH: Past Medical History:  Diagnosis Date   Elevated LDL cholesterol level    Headache    Hyperlipidemia    Low testosterone    Pericarditis    Reactive airway disease    allergic    Surgical History: Past Surgical History:  Procedure Laterality Date   RIB FRACTURE SURGERY     TONSILLECTOMY     VASECTOMY      Home Medications:  Allergies as of 02/28/2023       Reactions   Prednisone Palpitations        Medication List        Accurate as of February 28, 2023  4:35 PM. If you have any questions, ask your nurse or doctor.           STOP taking these medications    hydrOXYzine 25 MG capsule Commonly known as: VISTARIL   nystatin-triamcinolone ointment Commonly known as: MYCOLOG       TAKE these medications    ALPRAZolam 1 MG tablet Commonly known as: XANAX Take 1 tablet (1 mg total) by mouth as needed for anxiety. Take one hour before flight.   meclizine 25 MG tablet Commonly known as: ANTIVERT Take 1 tablet (25 mg total) by mouth 3 (three) times daily as needed for dizziness.   ondansetron 4 MG tablet Commonly known as: Zofran Take 1 tablet (4 mg total) by mouth every 8 (eight) hours as needed for nausea or vomiting.   oxyCODONE-acetaminophen 7.5-325 MG tablet Commonly known as: Percocet Take 1 tablet by mouth every 6 (six) hours as needed for severe pain (pain score 7-10).   tamsulosin 0.4 MG Caps capsule Commonly known as: FLOMAX Take 1 capsule (0.4 mg total) by mouth daily.        Allergies:  Allergies  Allergen Reactions   Prednisone Palpitations    Family History: Family History  Problem Relation Age of Onset   Hypertension Mother    Hyperlipidemia Mother    Hypertension Father    Hyperlipidemia Father    Stroke Maternal Grandfather    Heart disease Paternal Grandfather  Social History:  reports that he quit smoking about 7 years ago. His smoking use included cigarettes. He has quit using smokeless tobacco.  His smokeless tobacco use included snuff. He reports current alcohol use. He reports that he does not use drugs.   Physical Exam: BP (!) 171/101   Pulse 76   Ht 5\' 8"  (1.727 m)   Wt 176 lb 6 oz (80 kg)   BMI 26.82 kg/m   Constitutional:  Alert and oriented, No acute distress. HEENT: [place] AT, moist mucus membranes.  Trachea midline, no masses. Neurologic: Grossly intact, no focal deficits, moving all 4 extremities. Psychiatric: Normal mood and affect.   Pertinent Imaging:  KUB performed today and personally reviewed. Radiologic interpretation is pending.     EXAM: CT ABDOMEN AND PELVIS WITHOUT CONTRAST  TECHNIQUE: Multidetector CT imaging of the abdomen and pelvis was performed following the standard protocol without IV contrast.  RADIATION DOSE REDUCTION: This exam was performed according to the departmental dose-optimization program which includes automated exposure control, adjustment of the mA and/or kV according to patient size and/or use of iterative reconstruction technique.  COMPARISON:  CT with IV contrast 03/31/2006.  FINDINGS: Lower chest: There is a 4 mm solid right middle lobe subpleural rounded nodule on 4:2, above the plane of the prior study and may or may not have been present back then. No follow-up imaging is recommended if the patient is low risk.  Again noted and unchanged, there is a 3 mm pleural-based left lower lobe nodule on 4:2.  Rest of the lung bases are clear.  The cardiac size is normal.  Hepatobiliary: Noncontrast attenuation of the liver is homogeneous. No masses seen without contrast. The liver is mildly steatotic. The gallbladder and bile ducts are unremarkable.  Pancreas: Unremarkable without contrast.  Spleen: Unremarkable without contrast. There previously was a 2.5 cm cyst of the medial spleen which has resolved.  Adrenals/Urinary Tract: There is no adrenal mass, no contour deforming abnormality of either kidney is seen without contrast.  On the right, there is a 4 mm obstructing UPJ stone with mild hydronephrosis and mild right perinephric edema.  Both kidneys demonstrate a few scattered punctate nonobstructive caliceal stones.  Both ureters are otherwise clear. The bladder is unremarkable for the degree of distention.  Stomach/Bowel: Stomach distended with food substrate. There is a normal wall thickness.  The unopacified small bowel is unremarkable. An appendix is not seen in this patient.  There is moderate retained stool cecum and ascending colon, normal stool burden in  the remainder.  Scattered sigmoid diverticula without evidence of colitis or diverticulitis.  Vascular/Lymphatic: Age-advanced aortic atherosclerosis. No enlarged abdominal or pelvic lymph nodes. Left pelvic phleboliths.  Reproductive: Prostate is unremarkable.  Other: No abdominal wall hernia or abnormality. Small left inguinal fat hernia. No abdominopelvic ascites.  Musculoskeletal: No acute or significant osseous findings. Shallow L5-S1 left paracentral disc protrusion is seen and may compromise the left S1 nerve root.  IMPRESSION: 1. 4 mm obstructing right UPJ stone with mild hydronephrosis and perinephric edema. Correlate clinically for infectious complication. 2. Bilateral punctate nonobstructive nephrolithiasis. 3. Constipation and diverticulosis. 4. Mild hepatic steatosis. 5. Small left inguinal fat hernia. 6. Shallow L5-S1 left paracentral disc protrusion which may compromise the left S1 nerve root. 7. Aortic atherosclerosis, age advanced. 8. 4 mm right middle lobe and 3 mm left lower lobe nodules. The 3 mm nodule was present previously and unchanged. The 4 mm nodule is above the plane of the prior study. No  follow-up needed if patient is low-risk.This recommendation follows the consensus statement: Guidelines for Management of Incidental Pulmonary Nodules Detected on CT Images: From the Fleischner Society 2017; Radiology 2017; 284:228-243.  Aortic Atherosclerosis (ICD10-I70.0).   Electronically Signed By: Almira Bar M.D. On: 02/26/2023 01:16  This was personally reviewed and I agree with the radiologic interpretation.   Assessment & Plan:    1. Right ureteral stone - Preoperative urine culture today - Plan for lithotripsy tomorrow if he elects - Flomax to facilitate stone passage. - Zofran for nausea. - Refill of Percocet  - Ensure no food or drink after midnight before the procedure. - Arrange for a driver post-procedure due to sedation. -  Complete and bring the [facility] paperwork for the lithotripsy. - Monitor for signs of infection or increased pain, and seek emergency care if necessary. - We discussed various treatment options for urolithiasis including observation with or without medical expulsive therapy, shockwave lithotripsy (SWL), ureteroscopy and laser lithotripsy with stent placement, and percutaneous nephrolithotomy.   We discussed that management is based on stone size, location, density, patient co-morbidities, and patient preference.    Stones <81mm in size have a >80% spontaneous passage rate. Data surrounding the use of tamsulosin for medical expulsive therapy is controversial, but meta analyses suggests it is most efficacious for distal stones between 5-75mm in size. Possible side effects include dizziness/lightheadedness, and retrograde ejaculation.   SWL has a lower stone free rate in a single procedure, but also a lower complication rate compared to ureteroscopy and avoids a stent and associated stent related symptoms. Possible complications include renal hematoma, steinstrasse, and need for additional treatment. We discussed the role of his increased skin to stone distance can lead to decreased efficacy with shockwave lithotripsy.   Ureteroscopy with laser lithotripsy and stent placement has a higher stone free rate than SWL in a single procedure, however increased complication rate including possible infection, ureteral injury, bleeding, and stent related morbidity. Common stent related symptoms include dysuria, urgency/frequency, and flank pain.   After an extensive discussion of the risks and benefits of the above treatment options, the patient would like to proceed with SWL   Return for SWL.  I have reviewed the above documentation for accuracy and completeness, and I agree with the above.   Vanna Scotland, MD   [facility] [address] [place], [place]  [id number] [phone]

## 2023-02-28 NOTE — Progress Notes (Signed)
Alec Gonzales,acting as a scribe for Alec Scotland, MD.,have documented all relevant documentation on the behalf of Alec Scotland, MD,as directed by  Alec Scotland, MD while in the presence of Alec Scotland, MD.  02/28/23 4:35 PM   Alec Gonzales December 27, 1980 [id number]  Referring provider: No referring provider defined for this encounter.  Chief Complaint  Patient presents with   Establish Care   Nephrolithiasis    HPI: 42 year-old male who presents today for further evaluation of a kidney stone.   He was seen and evaluated on Monday in the emergency room with severe right flank pain and found to have a 4 mm right UPJ stone with hydronephrosis. He was treated and discharged in the absence of concern for infection. He did have a mild leukocytosis in the emergency room. His urine showed RBC, but no other evidence of infection. His creatinine was slightly elevated at 1.23, but not much different than his baseline.   He follows up today in the urology clinic. He got a KUB earlier today, which shows mild distal migration of this stone, now at the superior aspect of L4.   He reports not having taken any NSAIDs since the ER visit. He is considering treatment options, including shockwave lithotripsy, due to discomfort and the need to return to work. He is currently taking Percocet 7.5/325 acetaminophen for pain management.   PMH: Past Medical History:  Diagnosis Date   Elevated LDL cholesterol level    Headache    Hyperlipidemia    Low testosterone    Pericarditis    Reactive airway disease    allergic    Surgical History: Past Surgical History:  Procedure Laterality Date   RIB FRACTURE SURGERY     TONSILLECTOMY     VASECTOMY      Home Medications:  Allergies as of 02/28/2023       Reactions   Prednisone Palpitations        Medication List        Accurate as of February 28, 2023  4:35 PM. If you have any questions, ask your nurse or doctor.           STOP taking these medications    hydrOXYzine 25 MG capsule Commonly known as: VISTARIL   nystatin-triamcinolone ointment Commonly known as: MYCOLOG       TAKE these medications    ALPRAZolam 1 MG tablet Commonly known as: XANAX Take 1 tablet (1 mg total) by mouth as needed for anxiety. Take one hour before flight.   meclizine 25 MG tablet Commonly known as: ANTIVERT Take 1 tablet (25 mg total) by mouth 3 (three) times daily as needed for dizziness.   ondansetron 4 MG tablet Commonly known as: Zofran Take 1 tablet (4 mg total) by mouth every 8 (eight) hours as needed for nausea or vomiting.   oxyCODONE-acetaminophen 7.5-325 MG tablet Commonly known as: Percocet Take 1 tablet by mouth every 6 (six) hours as needed for severe pain (pain score 7-10).   tamsulosin 0.4 MG Caps capsule Commonly known as: FLOMAX Take 1 capsule (0.4 mg total) by mouth daily.        Allergies:  Allergies  Allergen Reactions   Prednisone Palpitations    Family History: Family History  Problem Relation Age of Onset   Hypertension Mother    Hyperlipidemia Mother    Hypertension Father    Hyperlipidemia Father    Stroke Maternal Grandfather    Heart disease Paternal Grandfather  Social History:  reports that he quit smoking about 7 years ago. His smoking use included cigarettes. He has quit using smokeless tobacco.  His smokeless tobacco use included snuff. He reports current alcohol use. He reports that he does not use drugs.   Physical Exam: BP (!) 171/101   Pulse 76   Ht 5\' 8"  (1.727 m)   Wt 176 lb 6 oz (80 kg)   BMI 26.82 kg/m   Constitutional:  Alert and oriented, No acute distress. HEENT: [place] AT, moist mucus membranes.  Trachea midline, no masses. Neurologic: Grossly intact, no focal deficits, moving all 4 extremities. Psychiatric: Normal mood and affect.   Pertinent Imaging:  KUB performed today and personally reviewed. Radiologic interpretation is pending.     EXAM: CT ABDOMEN AND PELVIS WITHOUT CONTRAST  TECHNIQUE: Multidetector CT imaging of the abdomen and pelvis was performed following the standard protocol without IV contrast.  RADIATION DOSE REDUCTION: This exam was performed according to the departmental dose-optimization program which includes automated exposure control, adjustment of the mA and/or kV according to patient size and/or use of iterative reconstruction technique.  COMPARISON:  CT with IV contrast 03/31/2006.  FINDINGS: Lower chest: There is a 4 mm solid right middle lobe subpleural rounded nodule on 4:2, above the plane of the prior study and may or may not have been present back then. No follow-up imaging is recommended if the patient is low risk.  Again noted and unchanged, there is a 3 mm pleural-based left lower lobe nodule on 4:2.  Rest of the lung bases are clear.  The cardiac size is normal.  Hepatobiliary: Noncontrast attenuation of the liver is homogeneous. No masses seen without contrast. The liver is mildly steatotic. The gallbladder and bile ducts are unremarkable.  Pancreas: Unremarkable without contrast.  Spleen: Unremarkable without contrast. There previously was a 2.5 cm cyst of the medial spleen which has resolved.  Adrenals/Urinary Tract: There is no adrenal mass, no contour deforming abnormality of either kidney is seen without contrast.  On the right, there is a 4 mm obstructing UPJ stone with mild hydronephrosis and mild right perinephric edema.  Both kidneys demonstrate a few scattered punctate nonobstructive caliceal stones.  Both ureters are otherwise clear. The bladder is unremarkable for the degree of distention.  Stomach/Bowel: Stomach distended with food substrate. There is a normal wall thickness.  The unopacified small bowel is unremarkable. An appendix is not seen in this patient.  There is moderate retained stool cecum and ascending colon, normal stool burden in  the remainder.  Scattered sigmoid diverticula without evidence of colitis or diverticulitis.  Vascular/Lymphatic: Age-advanced aortic atherosclerosis. No enlarged abdominal or pelvic lymph nodes. Left pelvic phleboliths.  Reproductive: Prostate is unremarkable.  Other: No abdominal wall hernia or abnormality. Small left inguinal fat hernia. No abdominopelvic ascites.  Musculoskeletal: No acute or significant osseous findings. Shallow L5-S1 left paracentral disc protrusion is seen and may compromise the left S1 nerve root.  IMPRESSION: 1. 4 mm obstructing right UPJ stone with mild hydronephrosis and perinephric edema. Correlate clinically for infectious complication. 2. Bilateral punctate nonobstructive nephrolithiasis. 3. Constipation and diverticulosis. 4. Mild hepatic steatosis. 5. Small left inguinal fat hernia. 6. Shallow L5-S1 left paracentral disc protrusion which may compromise the left S1 nerve root. 7. Aortic atherosclerosis, age advanced. 8. 4 mm right middle lobe and 3 mm left lower lobe nodules. The 3 mm nodule was present previously and unchanged. The 4 mm nodule is above the plane of the prior study. No  follow-up needed if patient is low-risk.This recommendation follows the consensus statement: Guidelines for Management of Incidental Pulmonary Nodules Detected on CT Images: From the Fleischner Society 2017; Radiology 2017; 284:228-243.  Aortic Atherosclerosis (ICD10-I70.0).   Electronically Signed By: Almira Bar M.D. On: 02/26/2023 01:16  This was personally reviewed and I agree with the radiologic interpretation.   Assessment & Plan:    1. Right ureteral stone - Preoperative urine culture today - Plan for lithotripsy tomorrow if he elects - Flomax to facilitate stone passage. - Zofran for nausea. - Refill of Percocet  - Ensure no food or drink after midnight before the procedure. - Arrange for a driver post-procedure due to sedation. -  Complete and bring the [facility] paperwork for the lithotripsy. - Monitor for signs of infection or increased pain, and seek emergency care if necessary. - We discussed various treatment options for urolithiasis including observation with or without medical expulsive therapy, shockwave lithotripsy (SWL), ureteroscopy and laser lithotripsy with stent placement, and percutaneous nephrolithotomy.   We discussed that management is based on stone size, location, density, patient co-morbidities, and patient preference.    Stones <30mm in size have a >80% spontaneous passage rate. Data surrounding the use of tamsulosin for medical expulsive therapy is controversial, but meta analyses suggests it is most efficacious for distal stones between 5-72mm in size. Possible side effects include dizziness/lightheadedness, and retrograde ejaculation.   SWL has a lower stone free rate in a single procedure, but also a lower complication rate compared to ureteroscopy and avoids a stent and associated stent related symptoms. Possible complications include renal hematoma, steinstrasse, and need for additional treatment. We discussed the role of his increased skin to stone distance can lead to decreased efficacy with shockwave lithotripsy.   Ureteroscopy with laser lithotripsy and stent placement has a higher stone free rate than SWL in a single procedure, however increased complication rate including possible infection, ureteral injury, bleeding, and stent related morbidity. Common stent related symptoms include dysuria, urgency/frequency, and flank pain.   After an extensive discussion of the risks and benefits of the above treatment options, the patient would like to proceed with SWL   Return for SWL.  I have reviewed the above documentation for accuracy and completeness, and I agree with the above.   Alec Scotland, MD   [facility] [address] [place], [place]  [address] [phone]

## 2023-03-01 ENCOUNTER — Ambulatory Visit: Payer: 59

## 2023-03-01 ENCOUNTER — Other Ambulatory Visit: Payer: Self-pay

## 2023-03-01 ENCOUNTER — Ambulatory Visit
Admission: RE | Admit: 2023-03-01 | Discharge: 2023-03-01 | Disposition: A | Discharge status: Home / Self Care | Payer: 59 | Attending: Urology | Admitting: Urology

## 2023-03-01 ENCOUNTER — Encounter: Payer: Self-pay | Admitting: Urology

## 2023-03-01 ENCOUNTER — Encounter
Admission: RE | Disposition: A | Discharge status: Home / Self Care | Payer: Self-pay | Source: Home / Self Care | Attending: Urology

## 2023-03-01 DIAGNOSIS — N201 Calculus of ureter: Secondary | ICD-10-CM

## 2023-03-01 DIAGNOSIS — N132 Hydronephrosis with renal and ureteral calculous obstruction: Secondary | ICD-10-CM | POA: Diagnosis not present

## 2023-03-01 DIAGNOSIS — I878 Other specified disorders of veins: Secondary | ICD-10-CM | POA: Diagnosis not present

## 2023-03-01 HISTORY — PX: EXTRACORPOREAL SHOCK WAVE LITHOTRIPSY: SHX1557

## 2023-03-01 SURGERY — EXTRACORPOREAL SHOCK WAVE LITHOTRIPSY (ESWL)
Anesthesia: Moderate Sedation | Laterality: Right

## 2023-03-01 MED ORDER — DIAZEPAM 5 MG PO TABS
ORAL_TABLET | ORAL | Status: AC
  Filled 2023-03-01: qty 2

## 2023-03-01 MED ORDER — DIPHENHYDRAMINE HCL 25 MG PO CAPS
25.0000 mg | ORAL_CAPSULE | ORAL | Status: AC
  Administered 2023-03-01: 25 mg via ORAL

## 2023-03-01 MED ORDER — ONDANSETRON HCL 4 MG/2ML IJ SOLN: 4.0000 mg | Freq: Once | INTRAMUSCULAR | Status: DC

## 2023-03-01 MED ORDER — ONDANSETRON HCL 4 MG/2ML IJ SOLN
INTRAMUSCULAR | Status: AC
  Filled 2023-03-01: qty 2

## 2023-03-01 MED ORDER — CEPHALEXIN 500 MG PO CAPS
500.0000 mg | ORAL_CAPSULE | Freq: Once | ORAL | Status: AC
  Administered 2023-03-01: 500 mg via ORAL

## 2023-03-01 MED ORDER — CEPHALEXIN 500 MG PO CAPS
ORAL_CAPSULE | ORAL | Status: AC
  Filled 2023-03-01: qty 1

## 2023-03-01 MED ORDER — DIPHENHYDRAMINE HCL 25 MG PO CAPS
ORAL_CAPSULE | ORAL | Status: AC
  Filled 2023-03-01: qty 1

## 2023-03-01 MED ORDER — CEPHALEXIN 250 MG PO CAPS: 500.0000 mg | ORAL_CAPSULE | Freq: Once | ORAL | Status: DC

## 2023-03-01 MED ORDER — DIAZEPAM 5 MG PO TABS
10.0000 mg | ORAL_TABLET | ORAL | Status: AC
  Administered 2023-03-01: 10 mg via ORAL

## 2023-03-01 MED ORDER — SODIUM CHLORIDE 0.9 % IV SOLN: INTRAVENOUS | Status: DC

## 2023-03-01 NOTE — Interval H&P Note (Signed)
History and Physical Interval Note:  03/01/2023 10:18 AM  [name]  has presented today for surgery, with the diagnosis of Right Ureteral Stone.  The various methods of treatment have been discussed with the patient and family. After consideration of risks, benefits and other options for treatment, the patient has consented to  Procedure(s): EXTRACORPOREAL SHOCK WAVE LITHOTRIPSY (ESWL) (Right) as a surgical intervention.  The patient's history has been reviewed, patient examined, no change in status, stable for surgery.  I have reviewed the patient's chart and labs.  Questions were answered to the patient's satisfaction.    RRR CTAB   Alec Gonzales

## 2023-03-01 NOTE — Discharge Instructions (Signed)
See [facility] discharge instructions in chart.  

## 2023-03-02 ENCOUNTER — Encounter: Payer: Self-pay | Admitting: Urology

## 2023-03-02 ENCOUNTER — Other Ambulatory Visit: Payer: Self-pay

## 2023-03-02 DIAGNOSIS — N201 Calculus of ureter: Secondary | ICD-10-CM

## 2023-03-03 LAB — CULTURE, URINE COMPREHENSIVE

## 2023-03-09 ENCOUNTER — Ambulatory Visit: Payer: Self-pay

## 2023-03-09 ENCOUNTER — Other Ambulatory Visit: Payer: Self-pay | Admitting: Physician Assistant

## 2023-03-09 DIAGNOSIS — N200 Calculus of kidney: Secondary | ICD-10-CM

## 2023-03-09 DIAGNOSIS — Z01818 Encounter for other preprocedural examination: Secondary | ICD-10-CM

## 2023-03-09 DIAGNOSIS — R300 Dysuria: Secondary | ICD-10-CM

## 2023-03-09 LAB — POCT URINALYSIS DIPSTICK
Bilirubin, UA: NEGATIVE
Glucose, UA: NEGATIVE
Ketones, UA: NEGATIVE
Leukocytes, UA: NEGATIVE
Nitrite, UA: NEGATIVE
Protein, UA: NEGATIVE
Spec Grav, UA: <=1.005 — AB (ref 1.010–1.025)
Urobilinogen, UA: 0.2 U/dL
pH, UA: 6 (ref 5.0–8.0)

## 2023-03-09 MED ORDER — OXYCODONE-ACETAMINOPHEN 7.5-325 MG PO TABS: 1.0000 | ORAL_TABLET | Freq: Four times a day (QID) | ORAL | 0 refills | Status: DC | PRN

## 2023-03-09 MED ORDER — PHENAZOPYRIDINE HCL 95 MG PO TABS: 95.0000 mg | ORAL_TABLET | Freq: Three times a day (TID) | ORAL | 0 refills | Status: DC | PRN

## 2023-03-09 NOTE — Progress Notes (Signed)
Pt presents today with burning while voiding x 3days. Pt passed a stone on the 03-03-23. Pt has also had LITHOTRIPSY performed on 03/01/23. Pt came in to test urine due to burning sensation and pt passed two more stones.

## 2023-03-12 LAB — URINE CULTURE: Organism ID, Bacteria: NO GROWTH

## 2023-03-13 ENCOUNTER — Encounter: Payer: Self-pay | Admitting: Urology

## 2023-03-21 ENCOUNTER — Ambulatory Visit (INDEPENDENT_AMBULATORY_CARE_PROVIDER_SITE_OTHER): Payer: 59 | Admitting: Physician Assistant

## 2023-03-21 ENCOUNTER — Ambulatory Visit: Payer: 59 | Admitting: Physician Assistant

## 2023-03-21 ENCOUNTER — Ambulatory Visit
Admission: RE | Admit: 2023-03-21 | Discharge: 2023-03-21 | Disposition: A | Discharge status: Home / Self Care | Payer: 59 | Attending: Urology | Admitting: Urology

## 2023-03-21 ENCOUNTER — Encounter: Payer: Self-pay | Admitting: Physician Assistant

## 2023-03-21 ENCOUNTER — Ambulatory Visit
Admission: RE | Admit: 2023-03-21 | Discharge: 2023-03-21 | Disposition: A | Discharge status: Home / Self Care | Payer: 59 | Source: Ambulatory Visit | Attending: Urology | Admitting: Urology

## 2023-03-21 VITALS — BP 142/92 | HR 79 | Ht 68.0 in | Wt 170.0 lb

## 2023-03-21 DIAGNOSIS — N200 Calculus of kidney: Secondary | ICD-10-CM

## 2023-03-21 DIAGNOSIS — N201 Calculus of ureter: Secondary | ICD-10-CM | POA: Diagnosis not present

## 2023-03-21 NOTE — Progress Notes (Signed)
 03/21/2023 9:52 AM   Alec Gonzales [birth year] [id number]  CC: Chief Complaint  Patient presents with   Nephrolithiasis   HPI: Alec Gonzales is a 43 y.o. male who underwent ESWL with Dr. Penne on 03/01/2023 for management of a 4 mm proximal right ureteral stone who presents today for postop follow-up.  Operative note describes smudging of the stone.   Today he reports he passed multiple stone fragments in the first 8 days after his procedure.  His pain has resolved.  He has no family history of nephrolithiasis.  KUB today with interval resolution of the proximal right ureteral stone.  On review of CT stone study dated 02/26/2023, I anticipate he has residual bilateral punctate nonobstructing renal stones.  In-office UA and microscopy today pan negative.  PMH: Past Medical History:  Diagnosis Date   Elevated LDL cholesterol level    Headache    Hyperlipidemia    Low testosterone     Pericarditis    Reactive airway disease    allergic    Surgical History: Past Surgical History:  Procedure Laterality Date   EXTRACORPOREAL SHOCK WAVE LITHOTRIPSY Right 03/01/2023   Procedure: EXTRACORPOREAL SHOCK WAVE LITHOTRIPSY (ESWL);  Surgeon: Penne Knee, MD;  Location: [facility] ORS;  Service: Urology;  Laterality: Right;   RIB FRACTURE SURGERY     TONSILLECTOMY     VASECTOMY      Home Medications:  Allergies as of 03/21/2023       Reactions   Prednisone Palpitations        Medication List        Accurate as of March 21, 2023  9:52 AM. If you have any questions, ask your nurse or doctor.          ALPRAZolam  1 MG tablet Commonly known as: XANAX  Take 1 tablet (1 mg total) by mouth as needed for anxiety. Take one hour before flight.   meclizine  25 MG tablet Commonly known as: ANTIVERT  Take 1 tablet (25 mg total) by mouth 3 (three) times daily as needed for dizziness.   ondansetron  4 MG tablet Commonly known as: Zofran  Take 1 tablet (4 mg total) by mouth every 8  (eight) hours as needed for nausea or vomiting.   oxyCODONE -acetaminophen  7.5-325 MG tablet Commonly known as: Percocet Take 1 tablet by mouth every 6 (six) hours as needed for severe pain (pain score 7-10).   phenazopyridine  95 MG tablet Commonly known as: PYRIDIUM  Take 1 tablet (95 mg total) by mouth 3 (three) times daily as needed for pain.   tamsulosin  0.4 MG Caps capsule Commonly known as: FLOMAX  Take 1 capsule (0.4 mg total) by mouth daily.        Allergies:  Allergies  Allergen Reactions   Prednisone Palpitations    Family History: Family History  Problem Relation Age of Onset   Hypertension Mother    Hyperlipidemia Mother    Hypertension Father    Hyperlipidemia Father    Stroke Maternal Grandfather    Heart disease Paternal Grandfather     Social History:   reports that he quit smoking about 7 years ago. His smoking use included cigarettes. He has quit using smokeless tobacco.  His smokeless tobacco use included snuff. He reports current alcohol use. He reports that he does not use drugs.  Physical Exam: BP (!) 142/92   Pulse 79   Ht 5' 8 (1.727 m)   Wt 170 lb (77.1 kg)   BMI 25.85 kg/m   Constitutional:  Alert and oriented,  no acute distress, nontoxic appearing HEENT: [place], AT Cardiovascular: No clubbing, cyanosis, or edema Respiratory: Normal respiratory effort, no increased work of breathing Skin: No rashes, bruises or suspicious lesions Neurologic: Grossly intact, no focal deficits, moving all 4 extremities Psychiatric: Normal mood and affect  Laboratory Data: Results for orders placed or performed in visit on 03/21/23  Microscopic Examination   Collection Time: 03/21/23  9:35 AM   Urine  Result Value Ref Range   WBC, UA 0-5 0 - 5 /hpf   RBC, Urine None seen 0 - 2 /hpf   Epithelial Cells (non renal) 0-10 0 - 10 /hpf   Bacteria, UA None seen None seen/Few  Urinalysis, Complete   Collection Time: 03/21/23  9:35 AM  Result Value Ref Range    Specific Gravity, UA 1.015 1.005 - 1.030   pH, UA 7.0 5.0 - 7.5   Color, UA Yellow Yellow   Appearance Ur Clear Clear   Leukocytes,UA Negative Negative   Protein,UA Negative Negative/Trace   Glucose, UA Negative Negative   Ketones, UA Negative Negative   RBC, UA Negative Negative   Bilirubin, UA Negative Negative   Urobilinogen, Ur 0.2 0.2 - 1.0 mg/dL   Nitrite, UA Negative Negative   Microscopic Examination See below:    Pertinent Imaging: KUB, 03/21/2023: See Epic  I personally reviewed the images referenced above and note interval resolution of the proximal right ureteral stone.  Assessment & Plan:   1. Right ureteral stone (Primary) Stone has passed, UA is bland, KUB with no residual fragments.  We discussed that he has punctate bilateral renal stones.  Will send fragments for analysis and reach out via MyChart with prevention recommendations.  He prefers to follow-up with us  annually, which is reasonable. - Urinalysis, Complete - Calculi, with Photograph (to Clinical Lab) - Abdomen 1 view (KUB); Future   Return in about 1 year (around 03/20/2024) for Annual stone visit with KUB prior, Will share stone analysis results via MyChart.  Lucie Hones, PA-C  [facility] [place] [address] [place], [place] [address] [phone]

## 2023-03-22 LAB — URINALYSIS, COMPLETE
Bilirubin, UA: NEGATIVE
Glucose, UA: NEGATIVE
Ketones, UA: NEGATIVE
Leukocytes,UA: NEGATIVE
Nitrite, UA: NEGATIVE
Protein,UA: NEGATIVE
RBC, UA: NEGATIVE
Specific Gravity, UA: 1.015 (ref 1.005–1.030)
Urobilinogen, Ur: 0.2 mg/dL (ref 0.2–1.0)
pH, UA: 7 (ref 5.0–7.5)

## 2023-03-22 LAB — MICROSCOPIC EXAMINATION
Bacteria, UA: NONE SEEN
RBC, Urine: NONE SEEN /[HPF] (ref 0–2)

## 2023-03-27 LAB — CALCULI, WITH PHOTOGRAPH (CLINICAL LAB)
Calcium Oxalate Monohydrate: 100 %
Weight Calculi: 51 mg

## 2023-07-12 ENCOUNTER — Other Ambulatory Visit: Payer: Self-pay

## 2023-07-13 ENCOUNTER — Ambulatory Visit: Payer: Self-pay

## 2023-07-13 DIAGNOSIS — R5383 Other fatigue: Secondary | ICD-10-CM

## 2023-07-13 DIAGNOSIS — Z0289 Encounter for other administrative examinations: Secondary | ICD-10-CM

## 2023-07-13 LAB — POCT URINALYSIS DIPSTICK
Bilirubin, UA: NEGATIVE
Blood, UA: NEGATIVE
Glucose, UA: NEGATIVE
Ketones, UA: NEGATIVE
Leukocytes, UA: NEGATIVE
Nitrite, UA: NEGATIVE
Protein, UA: NEGATIVE
Spec Grav, UA: 1.02 (ref 1.010–1.025)
Urobilinogen, UA: 0.2 U/dL
pH, UA: 6 (ref 5.0–8.0)

## 2023-07-13 NOTE — Progress Notes (Signed)
 Pt completed labs for FF physical. Gretel Acre

## 2023-07-15 LAB — CMP12+LP+TP+TSH+6AC+PSA+CBC…
ALT: 30 IU/L (ref 0–44)
AST: 22 IU/L (ref 0–40)
Albumin: 4.6 g/dL (ref 4.1–5.1)
Alkaline Phosphatase: 57 IU/L (ref 44–121)
BUN/Creatinine Ratio: 14 (ref 9–20)
BUN: 15 mg/dL (ref 6–24)
Basophils Absolute: 0.1 10*3/uL (ref 0.0–0.2)
Basos: 1 %
Bilirubin Total: 1 mg/dL (ref 0.0–1.2)
Calcium: 9.4 mg/dL (ref 8.7–10.2)
Chloride: 103 mmol/L (ref 96–106)
Chol/HDL Ratio: 7.2 ratio — ABNORMAL HIGH (ref 0.0–5.0)
Cholesterol, Total: 289 mg/dL — ABNORMAL HIGH (ref 100–199)
Creatinine, Ser: 1.05 mg/dL (ref 0.76–1.27)
EOS (ABSOLUTE): 0.3 10*3/uL (ref 0.0–0.4)
Eos: 4 %
Estimated CHD Risk: 1.5 times avg. — ABNORMAL HIGH (ref 0.0–1.0)
Free Thyroxine Index: 1.6 (ref 1.2–4.9)
GGT: 20 IU/L (ref 0–65)
Globulin, Total: 2.1 g/dL (ref 1.5–4.5)
Glucose: 86 mg/dL (ref 70–99)
HDL: 40 mg/dL (ref >39)
Hematocrit: 46.6 % (ref 37.5–51.0)
Hemoglobin: 16.3 g/dL (ref 13.0–17.7)
Immature Grans (Abs): 0 10*3/uL (ref 0.0–0.1)
Immature Granulocytes: 0 %
Iron: 107 ug/dL (ref 38–169)
LDH: 191 IU/L (ref 121–224)
LDL Chol Calc (NIH): 223 mg/dL — ABNORMAL HIGH (ref 0–99)
Lymphocytes Absolute: 2.4 10*3/uL (ref 0.7–3.1)
Lymphs: 29 %
MCH: 32.5 pg (ref 26.6–33.0)
MCHC: 35 g/dL (ref 31.5–35.7)
MCV: 93 fL (ref 79–97)
Monocytes Absolute: 0.6 10*3/uL (ref 0.1–0.9)
Monocytes: 7 %
Neutrophils Absolute: 5 10*3/uL (ref 1.4–7.0)
Neutrophils: 59 %
Phosphorus: 2.8 mg/dL (ref 2.8–4.1)
Platelets: 195 10*3/uL (ref 150–450)
Potassium: 4.3 mmol/L (ref 3.5–5.2)
Prostate Specific Ag, Serum: 0.7 ng/mL (ref 0.0–4.0)
RBC: 5.01 x10E6/uL (ref 4.14–5.80)
RDW: 12.1 % (ref 11.6–15.4)
Sodium: 138 mmol/L (ref 134–144)
T3 Uptake Ratio: 27 % (ref 24–39)
T4, Total: 6.1 ug/dL (ref 4.5–12.0)
TSH: 4.83 u[IU]/mL — ABNORMAL HIGH (ref 0.450–4.500)
Total Protein: 6.7 g/dL (ref 6.0–8.5)
Triglycerides: 139 mg/dL (ref 0–149)
Uric Acid: 5.1 mg/dL (ref 3.8–8.4)
VLDL Cholesterol Cal: 26 mg/dL (ref 5–40)
WBC: 8.4 10*3/uL (ref 3.4–10.8)
eGFR: 91 mL/min/{1.73_m2} (ref >59)

## 2023-07-15 LAB — TESTOSTERONE,FREE AND TOTAL
Testosterone, Free: 10.2 pg/mL (ref 6.8–21.5)
Testosterone: 365 ng/dL (ref 264–916)

## 2023-07-19 ENCOUNTER — Ambulatory Visit: Payer: Self-pay | Admitting: Physician Assistant

## 2023-07-19 ENCOUNTER — Encounter: Payer: Self-pay | Admitting: Physician Assistant

## 2023-07-19 VITALS — BP 130/90 | HR 64 | Temp 97.8°F | Ht 68.0 in | Wt 175.0 lb

## 2023-07-19 DIAGNOSIS — Z0000 Encounter for general adult medical examination without abnormal findings: Secondary | ICD-10-CM

## 2023-07-19 NOTE — Progress Notes (Signed)
 [facility] occupational health clinic  ____________________________________________   None    (approximate)  I have reviewed the triage vital signs and the nursing notes.   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 male patient presents for annual physical exam.  Voices no concerns or complaints.         Past Medical History:  Diagnosis Date   Elevated LDL cholesterol level    Headache    Hyperlipidemia    Low testosterone    Pericarditis    Reactive airway disease    allergic    Patient Active Problem List   Diagnosis Date Noted   Mild reactive airways disease 11/01/2015   Internal derangement of left knee 10/27/2012    Past Surgical History:  Procedure Laterality Date   EXTRACORPOREAL SHOCK WAVE LITHOTRIPSY Right 03/01/2023   Procedure: EXTRACORPOREAL SHOCK WAVE LITHOTRIPSY (ESWL);  Surgeon: Dustin Gimenez, MD;  Location: [facility] ORS;  Service: Urology;  Laterality: Right;   RIB FRACTURE SURGERY     TONSILLECTOMY     VASECTOMY      Prior to Admission medications   Medication Sig Start Date End Date Taking? Authorizing Provider  cetirizine  (ZYRTEC ) 10 MG tablet Take 10 mg by mouth daily.   Yes [provider]  ALPRAZolam  (XANAX ) 1 MG tablet Take 1 tablet (1 mg total) by mouth as needed for anxiety. Take one hour before flight. Patient not taking: Reported on 07/19/2023 11/06/22   Marcina Severe, PA-C  meclizine  (ANTIVERT ) 25 MG tablet Take 1 tablet (25 mg total) by mouth 3 (three) times daily as needed for dizziness. 01/15/20   Marcina Severe, PA-C    Allergies Prednisone  Family History  Problem Relation Age of Onset   Hypertension Mother    Hyperlipidemia Mother    Hypertension Father    Hyperlipidemia Father    Stroke Maternal Grandfather    Heart disease Paternal Grandfather     Social History Social History   Tobacco Use   Smoking status: Former    Current  packs/day: 0.00    Types: Cigarettes    Quit date: 08/03/2015    Years since quitting: 7.9   Smokeless tobacco: Former    Types: Snuff   Tobacco comments:    Occ smoke a pipe  Substance Use Topics   Alcohol use: Yes    Comment: occ   Drug use: No    Review of Systems Constitutional: No fever/chills Eyes: No visual changes. ENT: No sore throat. Cardiovascular: Denies chest pain. Respiratory: Denies shortness of breath. Gastrointestinal: No abdominal pain.  No nausea, no vomiting.  No diarrhea.  No constipation. Genitourinary: Negative for dysuria. Musculoskeletal: Negative for back pain. Skin: Negative for rash. Neurological: Negative for headaches, focal weakness or numbness. Endocrine: Hyperlipidemia  Allergic/Immunilogical: Prednisone ____________________________________________   PHYSICAL EXAM:  VITAL SIGNS: BP 130/90BP. 130/90. Data is abnormal. Taken on 07/19/23 8:38 AM  BP Location Left Arm  Patient Position Sitting  Cuff Size Large  Pulse Rate 64  Temp 97.8 F (36.6 C)  Temp Source Temporal  Weight 175 lb (79.4 kg)  Height 5\' 8"  (1.727 m)   BMI: 26.61 kg/m2  BSA: 1.95 m2   Constitutional: Alert and oriented. Well appearing and in no acute distress. Eyes: Conjunctivae are normal. PERRL. EOMI. Head: Atraumatic. Nose: No congestion/rhinnorhea. Mouth/Throat: Mucous membranes are moist.  Oropharynx non-erythematous. Neck: No stridor.  No cervical spine tenderness to palpation. Hematological/Lymphatic/Immunilogical: No  cervical lymphadenopathy. Cardiovascular: Normal rate, regular rhythm. Grossly normal heart sounds.  Good peripheral circulation. Respiratory: Normal respiratory effort.  No retractions. Lungs CTAB. Gastrointestinal: Soft and nontender. No distention. No abdominal bruits. No CVA tenderness. Genitourinary: Deferred Musculoskeletal: No lower extremity tenderness nor edema.  No joint effusions. Neurologic:  Normal speech and language. No gross  focal neurologic deficits are appreciated. No gait instability. Skin:  Skin is warm, dry and intact. No rash noted. Psychiatric: Mood and affect are normal. Speech and behavior are normal.  ____________________________________________   LABS            Component Ref Range & Units (hover) 6 d ago (07/13/23) 4 mo ago (03/21/23) 4 mo ago (03/09/23) 4 mo ago (02/28/23) 4 mo ago (02/26/23) 11 mo ago (08/10/22) 2 yr ago (07/11/21)  Color, UA YELLOW Yellow R yellow Yellow R  yellow Yellow  Clarity, UA FOAM  clear   cleare Clear  Glucose, UA Negative  Negative   Negative Negative  Bilirubin, UA NEG Negative R neg Negative R  neg Negative  Ketones, UA NEG Negative R neg Negative R  neg Negative  Spec Grav, UA 1.020 1.015 R <=1.005 Abnormal  <1.005 Low  R  1.015 1.020  Blood, UA NEG Negative R 2+ Abnormal  Trace Abnormal  R  neg Negative  pH, UA 6.0 7.0 R 6.0 6.0 R  6.0 6.5  Protein, UA Negative Negative R Negative Negative R  Negative Negative  Urobilinogen, UA 0.2  0.2   0.2 0.2  Nitrite, UA NEG Negative R neg Negative R  neg Negative  Leukocytes, UA Negative Negative Negative Negative  Negative Negative  Appearance     CLEAR Abnormal  R dark   Odor      none                         Component Ref Range & Units (hover) 6 d ago (07/13/23) 4 mo ago (02/26/23) 4 mo ago (02/26/23) 11 mo ago (08/10/22) 1 yr ago (01/17/22) 2 yr ago (07/11/21) 2 yr ago (12/15/20)  Glucose 86  114 High  CM 77  87 83 CM  Uric Acid 5.1   6.0 CM  5.0 CM 7.6 CM  Comment:            Therapeutic target for gout patients: <6.0  BUN 15  22 High  R 26 High   19 R 25 High  R  Creatinine, Ser 1.05  1.23 R 1.08  0.90 1.14  eGFR 91   89  111 84  BUN/Creatinine Ratio 14   24 High   21 High  22 High   Sodium 138  138 R 136  144 140  Potassium 4.3  5.4 High  R, CM 5.0  4.7 4.2  Chloride 103  105 R 102  105 102  Calcium  9.4  9.6 R 9.2  9.3 9.8  Phosphorus 2.8   2.6 Low   3.1 2.5 Low   Total Protein 6.7   6.7  6.2 6.9   Albumin 4.6   4.6  4.4 R 4.9 R  Globulin, Total 2.1   2.1  1.8 2.0  Bilirubin Total 1.0   0.7  1.0 0.8  Alkaline Phosphatase 57   41 Low   50 47  LDH 191   210  141 176  AST 22   26  19 30   ALT 30   21  19 30   GGT 20  10  11 15   Iron 107   70  126 52  Cholesterol, Total 289 High    288 High  316 High  259 High  228 High   Triglycerides 139   114 93 89 85  HDL 40   47 59 51 48  VLDL Cholesterol Cal 26   20 15 15 15   LDL Chol Calc (NIH) 223 High    221 High  242 High  193 High  165 High   LDL CALC COMMENT: Comment        Comment: Consider evaluating for Familial Hypercholesterolemia(FH), if clinically indicated.  Chol/HDL Ratio 7.2 High    6.1 High  CM 5.4 High  CM 5.1 High  CM 4.8 CM                    Component Ref Range & Units (hover) 6 d ago 2 yr ago 3 yr ago  Testosterone 365 514 CM 409 CM  Comment: Adult male reference interval is based on a population of healthy nonobese males (BMI <30) between 39 and 61 years old. Travison, et.al. JCEM [phone]. PMID: [id number].  Testosterone, Free 10.2 11.8 R 12.2 R         ____________________________________________  EKG  Normal sinus rhythm 78 bpm ____________________________________________   ____________________________________________   INITIAL IMPRESSION / ASSESSMENT AND PLAN  As part of my medical decision making, I reviewed the following data within the electronic MEDICAL RECORD NUMBER Notes from prior ED visits and [place] Controlled Substance Database      No acute findings on physical exam EKG.  Labs reveal increase of cholesterol.  Patient declined medication and will try conservative measures.  Advise repeat lipid panel in 6 months.      ____________________________________________   FINAL CLINICAL IMPRESSION    ED Discharge Orders     None        Note:  This document was prepared using Dragon voice recognition software and may include unintentional dictation errors.

## 2023-08-07 ENCOUNTER — Other Ambulatory Visit: Payer: Self-pay | Admitting: Physician Assistant

## 2023-08-07 MED ORDER — SCOPOLAMINE 1 MG/3DAYS TD PT72: 1.0000 | MEDICATED_PATCH | TRANSDERMAL | 12 refills | Status: AC

## 2023-08-07 NOTE — Progress Notes (Signed)
 scop

## 2023-08-21 DIAGNOSIS — N528 Other male erectile dysfunction: Secondary | ICD-10-CM | POA: Diagnosis not present

## 2023-08-21 DIAGNOSIS — G4700 Insomnia, unspecified: Secondary | ICD-10-CM | POA: Diagnosis not present

## 2023-08-21 DIAGNOSIS — R0683 Snoring: Secondary | ICD-10-CM | POA: Diagnosis not present

## 2023-08-21 DIAGNOSIS — R918 Other nonspecific abnormal finding of lung field: Secondary | ICD-10-CM | POA: Diagnosis not present

## 2023-08-21 DIAGNOSIS — E7800 Pure hypercholesterolemia, unspecified: Secondary | ICD-10-CM | POA: Diagnosis not present

## 2023-08-21 DIAGNOSIS — Z1331 Encounter for screening for depression: Secondary | ICD-10-CM | POA: Diagnosis not present

## 2023-08-23 ENCOUNTER — Other Ambulatory Visit: Payer: Self-pay | Admitting: Internal Medicine

## 2023-08-23 DIAGNOSIS — R918 Other nonspecific abnormal finding of lung field: Secondary | ICD-10-CM

## 2023-08-23 DIAGNOSIS — Z1331 Encounter for screening for depression: Secondary | ICD-10-CM

## 2023-09-03 ENCOUNTER — Ambulatory Visit
Admission: RE | Admit: 2023-09-03 | Discharge: 2023-09-03 | Disposition: A | Discharge status: Home / Self Care | Source: Ambulatory Visit | Attending: Internal Medicine | Admitting: Internal Medicine

## 2023-09-03 DIAGNOSIS — R0602 Shortness of breath: Secondary | ICD-10-CM | POA: Diagnosis not present

## 2023-09-03 DIAGNOSIS — Z1331 Encounter for screening for depression: Secondary | ICD-10-CM | POA: Insufficient documentation

## 2023-09-03 DIAGNOSIS — R918 Other nonspecific abnormal finding of lung field: Secondary | ICD-10-CM | POA: Insufficient documentation

## 2023-09-03 MED ORDER — IOHEXOL 300 MG/ML  SOLN
75.0000 mL | Freq: Once | INTRAMUSCULAR | Status: AC | PRN
  Administered 2023-09-03: 75 mL via INTRAVENOUS

## 2024-01-11 ENCOUNTER — Other Ambulatory Visit: Payer: Self-pay

## 2024-01-11 DIAGNOSIS — F418 Other specified anxiety disorders: Secondary | ICD-10-CM

## 2024-01-11 MED ORDER — ALPRAZOLAM 1 MG PO TABS: 1.0000 mg | ORAL_TABLET | ORAL | 0 refills | Status: DC | PRN

## 2024-01-11 NOTE — Telephone Encounter (Signed)
 Alec Gonzales has a 5 hour flight next week (to destination & return flight).  [name] requesting two Xanax  tablets as he has received in the past for anxiety R/T flying.

## 2024-01-14 ENCOUNTER — Other Ambulatory Visit: Payer: Self-pay | Admitting: Physician Assistant

## 2024-01-14 ENCOUNTER — Other Ambulatory Visit: Payer: Self-pay

## 2024-01-14 DIAGNOSIS — F418 Other specified anxiety disorders: Secondary | ICD-10-CM

## 2024-01-14 MED ORDER — ALPRAZOLAM 1 MG PO TABS: 1.0000 mg | ORAL_TABLET | ORAL | 0 refills | Status: AC | PRN

## 2024-01-18 ENCOUNTER — Other Ambulatory Visit: Payer: Self-pay

## 2024-01-18 DIAGNOSIS — E7800 Pure hypercholesterolemia, unspecified: Secondary | ICD-10-CM

## 2024-01-19 LAB — LIPID PANEL
Chol/HDL Ratio: 6.1 ratio — ABNORMAL HIGH (ref 0.0–5.0)
Cholesterol, Total: 270 mg/dL — ABNORMAL HIGH (ref 100–199)
HDL: 44 mg/dL (ref >39)
LDL Chol Calc (NIH): 203 mg/dL — ABNORMAL HIGH (ref 0–99)
Triglycerides: 125 mg/dL (ref 0–149)
VLDL Cholesterol Cal: 23 mg/dL (ref 5–40)

## 2024-03-20 ENCOUNTER — Ambulatory Visit: Payer: Self-pay | Admitting: Physician Assistant
